# Patient Record
Sex: Female | Born: 1965 | Race: Black or African American | Hispanic: No | Marital: Single | Smoking: Former smoker
Health system: Southern US, Community
[De-identification: ages and names within clinical notes are randomized; demographics above are authoritative.]

## PROBLEM LIST (undated history)

## (undated) DIAGNOSIS — F40232 Fear of other medical care: Secondary | ICD-10-CM

## (undated) DIAGNOSIS — T8859XA Other complications of anesthesia, initial encounter: Secondary | ICD-10-CM

## (undated) HISTORY — PX: HERNIA REPAIR: SHX51

## (undated) HISTORY — DX: Morbid (severe) obesity due to excess calories: E66.01

---

## 1999-05-19 ENCOUNTER — Emergency Department (HOSPITAL_COMMUNITY): Admission: EM | Admit: 1999-05-19 | Discharge: 1999-05-19 | Payer: Self-pay | Admitting: Emergency Medicine

## 2000-05-09 ENCOUNTER — Encounter: Admission: RE | Admit: 2000-05-09 | Discharge: 2000-06-29 | Payer: Self-pay | Admitting: Sports Medicine

## 2000-07-10 ENCOUNTER — Encounter: Admission: RE | Admit: 2000-07-10 | Discharge: 2000-10-08 | Payer: Self-pay | Admitting: *Deleted

## 2002-09-22 ENCOUNTER — Emergency Department (HOSPITAL_COMMUNITY): Admission: EM | Admit: 2002-09-22 | Discharge: 2002-09-23 | Payer: Self-pay | Admitting: Emergency Medicine

## 2002-09-28 ENCOUNTER — Emergency Department (HOSPITAL_COMMUNITY): Admission: EM | Admit: 2002-09-28 | Discharge: 2002-09-28 | Payer: Self-pay | Admitting: Emergency Medicine

## 2003-08-05 ENCOUNTER — Emergency Department (HOSPITAL_COMMUNITY): Admission: EM | Admit: 2003-08-05 | Discharge: 2003-08-05 | Payer: Self-pay | Admitting: Emergency Medicine

## 2004-11-13 ENCOUNTER — Emergency Department (HOSPITAL_COMMUNITY): Admission: EM | Admit: 2004-11-13 | Discharge: 2004-11-13 | Payer: Self-pay | Admitting: Emergency Medicine

## 2005-04-02 ENCOUNTER — Emergency Department (HOSPITAL_COMMUNITY): Admission: EM | Admit: 2005-04-02 | Discharge: 2005-04-02 | Payer: Self-pay | Admitting: Emergency Medicine

## 2005-07-18 ENCOUNTER — Emergency Department (HOSPITAL_COMMUNITY): Admission: EM | Admit: 2005-07-18 | Discharge: 2005-07-18 | Payer: Self-pay | Admitting: Emergency Medicine

## 2006-01-15 ENCOUNTER — Emergency Department (HOSPITAL_COMMUNITY): Admission: EM | Admit: 2006-01-15 | Discharge: 2006-01-16 | Payer: Self-pay | Admitting: Emergency Medicine

## 2006-05-20 ENCOUNTER — Emergency Department (HOSPITAL_COMMUNITY): Admission: EM | Admit: 2006-05-20 | Discharge: 2006-05-20 | Payer: Self-pay | Admitting: Emergency Medicine

## 2007-03-06 ENCOUNTER — Emergency Department (HOSPITAL_COMMUNITY): Admission: EM | Admit: 2007-03-06 | Discharge: 2007-03-06 | Payer: Self-pay | Admitting: *Deleted

## 2008-05-18 ENCOUNTER — Inpatient Hospital Stay (HOSPITAL_COMMUNITY): Admission: AD | Admit: 2008-05-18 | Discharge: 2008-05-21 | Payer: Self-pay | Admitting: Obstetrics & Gynecology

## 2008-10-07 ENCOUNTER — Emergency Department (HOSPITAL_COMMUNITY): Admission: EM | Admit: 2008-10-07 | Discharge: 2008-10-07 | Payer: Self-pay | Admitting: Emergency Medicine

## 2009-01-19 ENCOUNTER — Encounter: Admission: RE | Admit: 2009-01-19 | Discharge: 2009-01-19 | Payer: Self-pay | Admitting: Internal Medicine

## 2010-01-04 ENCOUNTER — Emergency Department (HOSPITAL_COMMUNITY): Admission: EM | Admit: 2010-01-04 | Discharge: 2010-01-04 | Payer: Self-pay | Admitting: Family Medicine

## 2010-04-28 ENCOUNTER — Emergency Department (HOSPITAL_COMMUNITY): Admission: EM | Admit: 2010-04-28 | Discharge: 2010-01-02 | Payer: Self-pay | Admitting: Emergency Medicine

## 2010-08-30 LAB — CBC
Hemoglobin: 13.5 g/dL (ref 12.0–15.0)
MCHC: 34.1 g/dL (ref 30.0–36.0)
MCV: 86.9 fL (ref 78.0–100.0)
RBC: 4.57 MIL/uL (ref 3.87–5.11)

## 2010-08-30 LAB — POCT I-STAT, CHEM 8
BUN: 10 mg/dL (ref 6–23)
Chloride: 106 mEq/L (ref 96–112)
Creatinine, Ser: 0.9 mg/dL (ref 0.4–1.2)
Potassium: 3.7 mEq/L (ref 3.5–5.1)
Sodium: 137 mEq/L (ref 135–145)

## 2010-08-30 LAB — DIFFERENTIAL
Basophils Relative: 2 % — ABNORMAL HIGH (ref 0–1)
Eosinophils Absolute: 0.2 10*3/uL (ref 0.0–0.7)
Monocytes Absolute: 0.3 10*3/uL (ref 0.1–1.0)
Monocytes Relative: 3 % (ref 3–12)

## 2011-02-24 LAB — CBC
HCT: 22.7 % — ABNORMAL LOW (ref 36.0–46.0)
Hemoglobin: 11.7 g/dL — ABNORMAL LOW (ref 12.0–15.0)
MCHC: 33.7 g/dL (ref 30.0–36.0)
MCV: 95.1 fL (ref 78.0–100.0)
MCV: 96.6 fL (ref 78.0–100.0)
Platelets: 152 10*3/uL (ref 150–400)
RBC: 3.66 MIL/uL — ABNORMAL LOW (ref 3.87–5.11)
WBC: 14.2 10*3/uL — ABNORMAL HIGH (ref 4.0–10.5)

## 2013-11-17 ENCOUNTER — Emergency Department (HOSPITAL_COMMUNITY)
Admission: EM | Admit: 2013-11-17 | Discharge: 2013-11-17 | Disposition: A | Discharge status: Home / Self Care | Payer: Self-pay | Attending: Emergency Medicine | Admitting: Emergency Medicine

## 2013-11-17 ENCOUNTER — Encounter (HOSPITAL_COMMUNITY): Payer: Self-pay | Admitting: Emergency Medicine

## 2013-11-17 DIAGNOSIS — F17200 Nicotine dependence, unspecified, uncomplicated: Secondary | ICD-10-CM | POA: Insufficient documentation

## 2013-11-17 DIAGNOSIS — L509 Urticaria, unspecified: Secondary | ICD-10-CM | POA: Insufficient documentation

## 2013-11-17 DIAGNOSIS — R110 Nausea: Secondary | ICD-10-CM | POA: Insufficient documentation

## 2013-11-17 MED ORDER — DEXAMETHASONE SODIUM PHOSPHATE 10 MG/ML IJ SOLN
10.0000 mg | Freq: Once | INTRAMUSCULAR | Status: AC
  Administered 2013-11-17: 10 mg via INTRAMUSCULAR
  Filled 2013-11-17: qty 1

## 2013-11-17 MED ORDER — HYDROXYZINE HCL 25 MG PO TABS: 25.0000 mg | ORAL_TABLET | Freq: Four times a day (QID) | ORAL | Status: DC

## 2013-11-17 MED ORDER — DIPHENHYDRAMINE HCL 25 MG PO CAPS
25.0000 mg | ORAL_CAPSULE | Freq: Once | ORAL | Status: AC
  Administered 2013-11-17: 25 mg via ORAL
  Filled 2013-11-17: qty 1

## 2013-11-17 MED ORDER — ONDANSETRON 4 MG PO TBDP
4.0000 mg | ORAL_TABLET | Freq: Once | ORAL | Status: AC
Start: 2013-11-17 — End: 2013-11-17
  Administered 2013-11-17: 4 mg via ORAL
  Filled 2013-11-17: qty 1

## 2013-11-17 NOTE — ED Provider Notes (Signed)
CSN: [id number]     Arrival date & time 11/17/13  1845 History  This chart was scribed for non-physician practitioner, Ivonne AndrewPeter Dammen, PA-C working with Shanna CiscoMegan E Docherty, MD by Luisa DagoPriscilla Tutu, ED scribe. This patient was seen in room WTR9/WTR9 and the patient's care was started at 10:17 PM.    Chief Complaint  Patient presents with  . Rash    x1 week  . Nausea    The history is provided by the patient. No language interpreter was used.   HPI Comments: Colleen Mills is a 48 y.o. female who presents to the Emergency Department complaining of a gradual onset rash that pt first noticed approximately 1 weeks ago. Pt is also complaining of associated nausea. She states that the rash has been slowly spreading. Pt states that when she gets bit by mosquitoes she gets really itchy. Pt reports using cortisone creme and benadryl at home without relief. Her last dose was this morning. Pt reports being allergic to Aleve. Denies any exposure to new soaps, facial swelling, fever, chills, diaphoresis, or food allergies. She denies any exposure to poison Ivy.   History reviewed. No pertinent past medical history. Past Surgical History  Procedure Laterality Date  . Hernia repair      as a child   No family history on file. History  Substance Use Topics  . Smoking status: Current Every Day Smoker -- 0.50 packs/day    Types: Cigarettes  . Smokeless tobacco: Never Used  . Alcohol Use: Yes     Comment: occ   OB History   Grav Para Term Preterm Abortions TAB SAB Ect Mult Living                 Review of Systems  Constitutional: Negative for fever and chills.  Respiratory: Negative for cough and shortness of breath.   Cardiovascular: Negative for chest pain.  Gastrointestinal: Positive for nausea. Negative for vomiting and abdominal pain.  Skin: Positive for rash.   Allergies  Review of patient's allergies indicates no known allergies.  Home Medications   Prior to Admission medications   Not on  File   BP 126/84  Pulse 86  Temp(Src) 98 F (36.7 C) (Oral)  Resp 14  SpO2 99%  LMP 10/16/2013  Physical Exam  Nursing note and vitals reviewed. Constitutional: She is oriented to person, place, and time. She appears well-developed and well-nourished. No distress.  HENT:  Head: Normocephalic and atraumatic.  Mouth/Throat: Oropharynx is clear and moist.  Eyes: Conjunctivae and EOM are normal.  Neck: Neck supple. No tracheal deviation present.  Cardiovascular: Normal rate.   Pulmonary/Chest: Effort normal. No respiratory distress. She has no wheezes. She has no rales.  Musculoskeletal: Normal range of motion.  Neurological: She is alert and oriented to person, place, and time.  Skin: Skin is warm and dry. Rash noted.  Urticarial type lesions to bilateral thighs and lower extremities. Similar lesions to the lower abdomen and arms.  Psychiatric: She has a normal mood and affect. Her behavior is normal.    ED Course  Procedures  DIAGNOSTIC STUDIES: Oxygen Saturation is 99% on RA, normal by my interpretation.    COORDINATION OF CARE: 10:20 PM- Pt advised of plan for treatment and pt agrees. Advised pt to take cool showers for the next couple of days.     Medications  dexamethasone (DECADRON) injection 10 mg (not administered)  ondansetron (ZOFRAN-ODT) disintegrating tablet 4 mg (4 mg Oral Given 11/17/13 2036)  diphenhydrAMINE (BENADRYL) capsule  25 mg (25 mg Oral Given 11/17/13 2036)      MDM   Final diagnoses:  Hives      I personally performed the services described in this documentation, which was scribed in my presence. The recorded information has been reviewed and is accurate.    Angus SellerPeter S Dammen, PA-C 11/18/13 [phone]

## 2013-11-17 NOTE — Discharge Instructions (Signed)
You may continue Benadryl and hydrocortisone cream for your rash. Followup with a primary care provider.    Hives Hives are itchy, red, swollen areas of the skin. They can vary in size and location on your body. Hives can come and go for hours or several days (acute hives) or for several weeks (chronic hives). Hives do not spread from person to person (noncontagious). They may get worse with scratching, exercise, and emotional stress. CAUSES   Allergic reaction to food, additives, or drugs.  Infections, including the common cold.  Illness, such as vasculitis, lupus, or thyroid disease.  Exposure to sunlight, heat, or cold.  Exercise.  Stress.  Contact with chemicals. SYMPTOMS   Red or white swollen patches on the skin. The patches may change size, shape, and location quickly and repeatedly.  Itching.  Swelling of the hands, feet, and face. This may occur if hives develop deeper in the skin. DIAGNOSIS  Your caregiver can usually tell what is wrong by performing a physical exam. Skin or blood tests may also be done to determine the cause of your hives. In some cases, the cause cannot be determined. TREATMENT  Mild cases usually get better with medicines such as antihistamines. Severe cases may require an emergency epinephrine injection. If the cause of your hives is known, treatment includes avoiding that trigger.  HOME CARE INSTRUCTIONS   Avoid causes that trigger your hives.  Take antihistamines as directed by your caregiver to reduce the severity of your hives. Non-sedating or low-sedating antihistamines are usually recommended. Do not drive while taking an antihistamine.  Take any other medicines prescribed for itching as directed by your caregiver.  Wear loose-fitting clothing.  Keep all follow-up appointments as directed by your caregiver. SEEK MEDICAL CARE IF:   You have persistent or severe itching that is not relieved with medicine.  You have painful or swollen  joints. SEEK IMMEDIATE MEDICAL CARE IF:   You have a fever.  Your tongue or lips are swollen.  You have trouble breathing or swallowing.  You feel tightness in the throat or chest.  You have abdominal pain. These problems may be the first sign of a life-threatening allergic reaction. Call your local emergency services (911 in [place] MAKE SURE YOU:   Understand these instructions.  Will watch your condition.  Will get help right away if you are not doing well or get worse. Document Released: 05/08/2005 Document Revised: 05/13/2013 Document Reviewed: 08/01/2011 [facility] Patient Information 2015 [place], [place]. This information is not intended to replace advice given to you by your health care provider. Make sure you discuss any questions you have with your health care provider.

## 2013-11-17 NOTE — ED Notes (Signed)
Patient c/o rash x1 week. States rash has spread since she arrived. Welts notes to patient legs and arms. Patient believes the rash may be related to mosquitoes. Patient states she has been using cortisone creme and benadryl at home without relief, last dose this AM.

## 2013-11-18 NOTE — ED Provider Notes (Signed)
Medical screening examination/treatment/procedure(s) were performed by non-physician practitioner and as supervising physician I was immediately available for consultation/collaboration.   Shanna CiscoMegan E Docherty, MD 11/18/13 [phone]

## 2014-05-06 ENCOUNTER — Emergency Department (HOSPITAL_COMMUNITY): Payer: Self-pay

## 2014-05-06 ENCOUNTER — Emergency Department (HOSPITAL_COMMUNITY)
Admission: EM | Admit: 2014-05-06 | Discharge: 2014-05-06 | Disposition: A | Discharge status: Home / Self Care | Payer: Self-pay | Attending: Emergency Medicine | Admitting: Emergency Medicine

## 2014-05-06 ENCOUNTER — Encounter (HOSPITAL_COMMUNITY): Payer: Self-pay | Admitting: Emergency Medicine

## 2014-05-06 DIAGNOSIS — Z720 Tobacco use: Secondary | ICD-10-CM | POA: Insufficient documentation

## 2014-05-06 DIAGNOSIS — R42 Dizziness and giddiness: Secondary | ICD-10-CM | POA: Insufficient documentation

## 2014-05-06 DIAGNOSIS — R0981 Nasal congestion: Secondary | ICD-10-CM | POA: Insufficient documentation

## 2014-05-06 LAB — BASIC METABOLIC PANEL
Anion gap: 12 (ref 5–15)
BUN: 11 mg/dL (ref 6–23)
CO2: 23 meq/L (ref 19–32)
Calcium: 9.8 mg/dL (ref 8.4–10.5)
Chloride: 101 mEq/L (ref 96–112)
Creatinine, Ser: 0.88 mg/dL (ref 0.50–1.10)
GFR calc Af Amer: 90 mL/min — ABNORMAL LOW (ref >90)
GFR calc non Af Amer: 78 mL/min — ABNORMAL LOW (ref >90)
GLUCOSE: 79 mg/dL (ref 70–99)
POTASSIUM: 4.3 meq/L (ref 3.7–5.3)
SODIUM: 136 meq/L — AB (ref 137–147)

## 2014-05-06 LAB — CBC WITH DIFFERENTIAL/PLATELET
Basophils Absolute: 0 10*3/uL (ref 0.0–0.1)
Basophils Relative: 0 % (ref 0–1)
Eosinophils Absolute: 0.4 10*3/uL (ref 0.0–0.7)
Eosinophils Relative: 5 % (ref 0–5)
HCT: 40.1 % (ref 36.0–46.0)
HEMOGLOBIN: 13.5 g/dL (ref 12.0–15.0)
LYMPHS ABS: 3 10*3/uL (ref 0.7–4.0)
LYMPHS PCT: 34 % (ref 12–46)
MCH: 30.3 pg (ref 26.0–34.0)
MCHC: 33.7 g/dL (ref 30.0–36.0)
MCV: 89.9 fL (ref 78.0–100.0)
MONOS PCT: 6 % (ref 3–12)
Monocytes Absolute: 0.6 10*3/uL (ref 0.1–1.0)
NEUTROS PCT: 55 % (ref 43–77)
Neutro Abs: 4.9 10*3/uL (ref 1.7–7.7)
PLATELETS: 235 10*3/uL (ref 150–400)
RBC: 4.46 MIL/uL (ref 3.87–5.11)
RDW: 13.5 % (ref 11.5–15.5)
WBC: 8.9 10*3/uL (ref 4.0–10.5)

## 2014-05-06 MED ORDER — MECLIZINE HCL 25 MG PO TABS: 25.0000 mg | ORAL_TABLET | Freq: Three times a day (TID) | ORAL | Status: DC | PRN

## 2014-05-06 MED ORDER — MECLIZINE HCL 25 MG PO TABS
25.0000 mg | ORAL_TABLET | Freq: Once | ORAL | Status: AC
  Administered 2014-05-06: 25 mg via ORAL
  Filled 2014-05-06: qty 1

## 2014-05-06 MED ORDER — SODIUM CHLORIDE 0.9 % IV BOLUS (SEPSIS)
500.0000 mL | Freq: Once | INTRAVENOUS | Status: AC
  Administered 2014-05-06: 500 mL via INTRAVENOUS

## 2014-05-06 NOTE — Discharge Instructions (Signed)

## 2014-05-06 NOTE — ED Notes (Signed)
Pt alert, arrives from home, c/o dizziness, onset was last weekend, speech clear, denies pain, ambulates to triage, resp even unlabored, skin pwd

## 2014-05-06 NOTE — ED Notes (Signed)
Pt complaint of dizziness since Sunday. Pt denies SOB, CP, or other pain at present time. Pt on cardiac monitor at present time; NSR.

## 2014-05-06 NOTE — ED Provider Notes (Signed)
CSN: [id number]     Arrival date & time 05/06/14  1301 History   First MD Initiated Contact with Patient 05/06/14 1502     Chief Complaint  Patient presents with  . Dizziness     (Consider location/radiation/quality/duration/timing/severity/associated sxs/prior Treatment) HPI Comments: Presents to the ER for evaluation of dizziness. She reports that she first started to feel lightheaded and "woozy" three days ago. The next day she started to feel sinus congestion which has continued. Today she feels worse, now feeling like she is spinning if she tilts her head back or turns her head. No headache. No hearing loss, cough, shortness of breath, chest pain, palpitations.  Patient is a 48 y.o. female presenting with dizziness.  Dizziness   History reviewed. No pertinent past medical history. Past Surgical History  Procedure Laterality Date  . Hernia repair      as a child   No family history on file. History  Substance Use Topics  . Smoking status: Current Every Day Smoker -- 0.50 packs/day    Types: Cigarettes  . Smokeless tobacco: Never Used  . Alcohol Use: Yes     Comment: occ   OB History    No data available     Review of Systems  Neurological: Positive for dizziness.  All other systems reviewed and are negative.     Allergies  Review of patient's allergies indicates no known allergies.  Home Medications   Prior to Admission medications   Medication Sig Start Date End Date Taking? Authorizing Provider  ibuprofen (ADVIL,MOTRIN) 200 MG tablet Take 400 mg by mouth every 6 (six) hours as needed for moderate pain.   Yes Historical Provider, MD  hydrOXYzine (ATARAX/VISTARIL) 25 MG tablet Take 1 tablet (25 mg total) by mouth every 6 (six) hours. Patient not taking: Reported on 05/06/2014 11/17/13   Phill MutterPeter S Dammen, PA-C   BP 113/83 mmHg  Pulse 72  Temp(Src) 98.3 F (36.8 C) (Oral)  Resp 18  SpO2 100%  LMP 04/08/2014 Physical Exam  Constitutional: She is oriented to  person, place, and time. She appears well-developed and well-nourished. No distress.  HENT:  Head: Normocephalic and atraumatic.  Right Ear: Hearing normal.  Left Ear: Hearing normal.  Nose: Nose normal.  Mouth/Throat: Oropharynx is clear and moist and mucous membranes are normal.  Eyes: Conjunctivae and EOM are normal. Pupils are equal, round, and reactive to light.  Neck: Normal range of motion. Neck supple.  Cardiovascular: Regular rhythm, S1 normal and S2 normal.  Exam reveals no gallop and no friction rub.   No murmur heard. Pulmonary/Chest: Effort normal and breath sounds normal. No respiratory distress. She exhibits no tenderness.  Abdominal: Soft. Normal appearance and bowel sounds are normal. There is no hepatosplenomegaly. There is no tenderness. There is no rebound, no guarding, no tenderness at McBurney's point and negative Murphy's sign. No hernia.  Musculoskeletal: Normal range of motion.  Neurological: She is alert and oriented to person, place, and time. She has normal strength. No cranial nerve deficit or sensory deficit. Coordination normal. GCS eye subscore is 4. GCS verbal subscore is 5. GCS motor subscore is 6.     Skin: Skin is warm, dry and intact. No rash noted. No cyanosis.  Psychiatric: She has a normal mood and affect. Her speech is normal and behavior is normal. Thought content normal.  Nursing note and vitals reviewed.  Additional Neuro Exam: Extraocular muscle movement: normal No visual field cut Pupils: equal and reactive both direct and consensual response  is normal No nystagmus present    Sensory function is intact to light touch, pinprick Proprioception intact  Grip strength 5/5 symmetric in upper extremities Lower extremity strength 5/5 against gravity No pronator drift Normal finger to nose bilaterally Normal heel to shin bilaterally  Gait: normal  ED Course  Procedures (including critical care time) Labs Review Labs Reviewed  BASIC  METABOLIC PANEL - Abnormal; Notable for the following:    Sodium 136 (*)    GFR calc non Af Amer 78 (*)    GFR calc Af Amer 90 (*)    All other components within normal limits  CBC WITH DIFFERENTIAL    Imaging Review Ct Head Wo Contrast  05/06/2014   CLINICAL DATA:  Headache with dizziness and nausea for 4 days.  EXAM: CT HEAD WITHOUT CONTRAST  TECHNIQUE: Contiguous axial images were obtained from the base of the skull through the vertex without intravenous contrast.  COMPARISON:  None.  FINDINGS: No evidence of an acute infarct, acute hemorrhage, mass lesion, mass effect or hydrocephalus. Visualized portions of the paranasal sinuses and mastoid air cells are clear.  IMPRESSION: Negative.   Electronically Signed   By: Leanna BattlesMelinda  Blietz M.D.   On: 05/06/2014 15:26     EKG Interpretation None      MDM   Final diagnoses:  Dizziness   Patient presents to the ER for evaluation of dizziness. She started as lightheadedness 3 days ago followed by sinus congestion. Today she has had more vertiginous type dizziness. Her neurologic examination was normal. No neurologic findings on examination. CT head was normal. Lab work was normal. Patient will be discharged, treated for peripheral vertigo, return if symptoms worsen.    Gilda Creasehristopher J. Pollina, MD 05/06/14 [phone]

## 2014-05-18 ENCOUNTER — Emergency Department (HOSPITAL_COMMUNITY): Payer: Self-pay

## 2014-05-18 ENCOUNTER — Emergency Department (HOSPITAL_COMMUNITY)
Admission: EM | Admit: 2014-05-18 | Discharge: 2014-05-18 | Disposition: A | Discharge status: Home / Self Care | Payer: Self-pay | Attending: Emergency Medicine | Admitting: Emergency Medicine

## 2014-05-18 ENCOUNTER — Encounter (HOSPITAL_COMMUNITY): Payer: Self-pay | Admitting: *Deleted

## 2014-05-18 DIAGNOSIS — Z79899 Other long term (current) drug therapy: Secondary | ICD-10-CM | POA: Insufficient documentation

## 2014-05-18 DIAGNOSIS — R0789 Other chest pain: Secondary | ICD-10-CM | POA: Insufficient documentation

## 2014-05-18 DIAGNOSIS — R0600 Dyspnea, unspecified: Secondary | ICD-10-CM | POA: Insufficient documentation

## 2014-05-18 LAB — BASIC METABOLIC PANEL
ANION GAP: 5 (ref 5–15)
BUN: 10 mg/dL (ref 6–23)
CALCIUM: 8.5 mg/dL (ref 8.4–10.5)
CHLORIDE: 106 meq/L (ref 96–112)
CO2: 24 mmol/L (ref 19–32)
CREATININE: 0.79 mg/dL (ref 0.50–1.10)
GFR calc Af Amer: >90 mL/min (ref >90)
GFR calc non Af Amer: >90 mL/min (ref >90)
GLUCOSE: 92 mg/dL (ref 70–99)
Potassium: 4 mmol/L (ref 3.5–5.1)
Sodium: 135 mmol/L (ref 135–145)

## 2014-05-18 LAB — CBC
HEMATOCRIT: 38.4 % (ref 36.0–46.0)
HEMOGLOBIN: 12.9 g/dL (ref 12.0–15.0)
MCH: 30.3 pg (ref 26.0–34.0)
MCHC: 33.6 g/dL (ref 30.0–36.0)
MCV: 90.1 fL (ref 78.0–100.0)
Platelets: 252 10*3/uL (ref 150–400)
RBC: 4.26 MIL/uL (ref 3.87–5.11)
RDW: 13.6 % (ref 11.5–15.5)
WBC: 9.3 10*3/uL (ref 4.0–10.5)

## 2014-05-18 LAB — D-DIMER, QUANTITATIVE: D-Dimer, Quant: 0.33 ug/mL-FEU (ref 0.00–0.48)

## 2014-05-18 LAB — TROPONIN I

## 2014-05-18 LAB — BRAIN NATRIURETIC PEPTIDE: B Natriuretic Peptide: 27.6 pg/mL (ref 0.0–100.0)

## 2014-05-18 MED ORDER — ALBUTEROL SULFATE (2.5 MG/3ML) 0.083% IN NEBU
5.0000 mg | INHALATION_SOLUTION | Freq: Once | RESPIRATORY_TRACT | Status: AC
  Administered 2014-05-18: 5 mg via RESPIRATORY_TRACT
  Filled 2014-05-18: qty 6

## 2014-05-18 MED ORDER — ALBUTEROL SULFATE HFA 108 (90 BASE) MCG/ACT IN AERS: 2.0000 | INHALATION_SPRAY | RESPIRATORY_TRACT | Status: DC | PRN

## 2014-05-18 NOTE — Discharge Instructions (Signed)
It was our pleasure to provide your ER care today - we hope that you feel better.  Avoid smoking.  Use albuterol inhaler as need if wheezing.  If cough, try mucinex or robutussin as need.  Follow up with primary care doctor in coming week for recheck - see referral.  Return to ER if worse, new symptoms, fevers, chest pain, trouble breathing, other concern.     Shortness of Breath Shortness of breath means you have trouble breathing. It could also mean that you have a medical problem. You should get immediate medical care for shortness of breath. CAUSES   Not enough oxygen in the air such as with high altitudes or a smoke-filled room.  Certain lung diseases, infections, or problems.  Heart disease or conditions, such as angina or heart failure.  Low red blood cells (anemia).  Poor physical fitness, which can cause shortness of breath when you exercise.  Chest or back injuries or stiffness.  Being overweight.  Smoking.  Anxiety, which can make you feel like you are not getting enough air. DIAGNOSIS  Serious medical problems can often be found during your physical exam. Tests may also be done to determine why you are having shortness of breath. Tests may include:  Chest X-rays.  Lung function tests.  Blood tests.  An electrocardiogram (ECG).  An ambulatory electrocardiogram. An ambulatory ECG records your heartbeat patterns over a 24-hour period.  Exercise testing.  A transthoracic echocardiogram (TTE). During echocardiography, sound waves are used to evaluate how blood flows through your heart.  A transesophageal echocardiogram (TEE).  Imaging scans. Your health care provider may not be able to find a cause for your shortness of breath after your exam. In this case, it is important to have a follow-up exam with your health care provider as directed.  TREATMENT  Treatment for shortness of breath depends on the cause of your symptoms and can vary greatly. HOME  CARE INSTRUCTIONS   Do not smoke. Smoking is a common cause of shortness of breath. If you smoke, ask for help to quit.  Avoid being around chemicals or things that may bother your breathing, such as paint fumes and dust.  Rest as needed. Slowly resume your usual activities.  If medicines were prescribed, take them as directed for the full length of time directed. This includes oxygen and any inhaled medicines.  Keep all follow-up appointments as directed by your health care provider. SEEK MEDICAL CARE IF:   Your condition does not improve in the time expected.  You have a hard time doing your normal activities even with rest.  You have any new symptoms. SEEK IMMEDIATE MEDICAL CARE IF:   Your shortness of breath gets worse.  You feel light-headed, faint, or develop a cough not controlled with medicines.  You start coughing up blood.  You have pain with breathing.  You have chest pain or pain in your arms, shoulders, or abdomen.  You have a fever.  You are unable to walk up stairs or exercise the way you normally do. MAKE SURE YOU:  Understand these instructions.  Will watch your condition.  Will get help right away if you are not doing well or get worse. Document Released: 01/31/2001 Document Revised: 05/13/2013 Document Reviewed: 07/24/2011 [facility] Patient Information 2015 [place], [place]. This information is not intended to replace advice given to you by your health care provider. Make sure you discuss any questions you have with your health care provider.    Smoking Hazards Smoking cigarettes  is extremely bad for your health. Tobacco smoke has over 200 known poisons in it. It contains the poisonous gases nitrogen oxide and carbon monoxide. There are over 60 chemicals in tobacco smoke that cause cancer. Some of the chemicals found in cigarette smoke include:   Cyanide.   Benzene.   Formaldehyde.   Methanol (wood alcohol).   Acetylene (fuel used in  welding torches).   Ammonia.  Even smoking lightly shortens your life expectancy by several years. You can greatly reduce the risk of medical problems for you and your family by stopping now. Smoking is the most preventable cause of death and disease in our society. Within days of quitting smoking, your circulation improves, you decrease the risk of having a heart attack, and your lung capacity improves. There may be some increased phlegm in the first few days after quitting, and it may take months for your lungs to clear up completely. Quitting for 10 years reduces your risk of developing lung cancer to almost that of a nonsmoker.  WHAT ARE THE RISKS OF SMOKING? Cigarette smokers have an increased risk of many serious medical problems, including:  Lung cancer.   Lung disease (such as pneumonia, bronchitis, and emphysema).   Heart attack and chest pain due to the heart not getting enough oxygen (angina).   Heart disease and peripheral blood vessel disease.   Hypertension.   Stroke.   Oral cancer (cancer of the lip, mouth, or voice box).   Bladder cancer.   Pancreatic cancer.   Cervical cancer.   Pregnancy complications, including premature birth.   Stillbirths and smaller newborn babies, birth defects, and genetic damage to sperm.   Early menopause.   Lower estrogen level for women.   Infertility.   Facial wrinkles.   Blindness.   Increased risk of broken bones (fractures).   Senile dementia.   Stomach ulcers and internal bleeding.   Delayed wound healing and increased risk of complications during surgery. Because of secondhand smoke exposure, children of smokers have an increased risk of the following:   Sudden infant death syndrome (SIDS).   Respiratory infections.   Lung cancer.   Heart disease.   Ear infections.  WHY IS SMOKING ADDICTIVE? Nicotine is the chemical agent in tobacco that is capable of causing addiction or  dependence. When you smoke and inhale, nicotine is absorbed rapidly into the bloodstream through your lungs. Both inhaled and noninhaled nicotine may be addictive.  WHAT ARE THE BENEFITS OF QUITTING?  There are many health benefits to quitting smoking. Some are:   The likelihood of developing cancer and heart disease decreases. Health improvements are seen almost immediately.   Blood pressure, pulse rate, and breathing patterns start returning to normal soon after quitting.   People who quit may see an improvement in their overall quality of life.  HOW DO YOU QUIT SMOKING? Smoking is an addiction with both physical and psychological effects, and longtime habits can be hard to change. Your health care provider can recommend:  Programs and community resources, which may include group support, education, or therapy.  Replacement products, such as patches, gum, and nasal sprays. Use these products only as directed. Do not replace cigarette smoking with electronic cigarettes (commonly called e-cigarettes). The safety of e-cigarettes is unknown, and some may contain harmful chemicals. FOR MORE INFORMATION  American Lung Association: www.lung.org  American Cancer Society: www.cancer.org Document Released: 06/15/2004 Document Revised: 02/26/2013 Document Reviewed: 10/28/2012 [facility] Patient Information 2015 [place]. This information is not intended to  replace advice given to you by your health care provider. Make sure you discuss any questions you have with your health care provider.     Chest Pain (Nonspecific) It is often hard to give a specific diagnosis for the cause of chest pain. There is always a chance that your pain could be related to something serious, such as a heart attack or a blood clot in the lungs. You need to follow up with your health care provider for further evaluation. CAUSES   Heartburn.  Pneumonia or bronchitis.  Anxiety or stress.  Inflammation around  your heart (pericarditis) or lung (pleuritis or pleurisy).  A blood clot in the lung.  A collapsed lung (pneumothorax). It can develop suddenly on its own (spontaneous pneumothorax) or from trauma to the chest.  Shingles infection (herpes zoster virus). The chest wall is composed of bones, muscles, and cartilage. Any of these can be the source of the pain.  The bones can be bruised by injury.  The muscles or cartilage can be strained by coughing or overwork.  The cartilage can be affected by inflammation and become sore (costochondritis). DIAGNOSIS  Lab tests or other studies may be needed to find the cause of your pain. Your health care provider may have you take a test called an ambulatory electrocardiogram (ECG). An ECG records your heartbeat patterns over a 24-hour period. You may also have other tests, such as:  Transthoracic echocardiogram (TTE). During echocardiography, sound waves are used to evaluate how blood flows through your heart.  Transesophageal echocardiogram (TEE).  Cardiac monitoring. This allows your health care provider to monitor your heart rate and rhythm in real time.  Holter monitor. This is a portable device that records your heartbeat and can help diagnose heart arrhythmias. It allows your health care provider to track your heart activity for several days, if needed.  Stress tests by exercise or by giving medicine that makes the heart beat faster. TREATMENT   Treatment depends on what may be causing your chest pain. Treatment may include:  Acid blockers for heartburn.  Anti-inflammatory medicine.  Pain medicine for inflammatory conditions.  Antibiotics if an infection is present.  You may be advised to change lifestyle habits. This includes stopping smoking and avoiding alcohol, caffeine, and chocolate.  You may be advised to keep your head raised (elevated) when sleeping. This reduces the chance of acid going backward from your stomach into your  esophagus. Most of the time, nonspecific chest pain will improve within 2-3 days with rest and mild pain medicine.  HOME CARE INSTRUCTIONS   If antibiotics were prescribed, take them as directed. Finish them even if you start to feel better.  For the next few days, avoid physical activities that bring on chest pain. Continue physical activities as directed.  Do not use any tobacco products, including cigarettes, chewing tobacco, or electronic cigarettes.  Avoid drinking alcohol.  Only take medicine as directed by your health care provider.  Follow your health care provider's suggestions for further testing if your chest pain does not go away.  Keep any follow-up appointments you made. If you do not go to an appointment, you could develop lasting (chronic) problems with pain. If there is any problem keeping an appointment, call to reschedule. SEEK MEDICAL CARE IF:   Your chest pain does not go away, even after treatment.  You have a rash with blisters on your chest.  You have a fever. SEEK IMMEDIATE MEDICAL CARE IF:   You have increased chest  pain or pain that spreads to your arm, neck, jaw, back, or abdomen.  You have shortness of breath.  You have an increasing cough, or you cough up blood.  You have severe back or abdominal pain.  You feel nauseous or vomit.  You have severe weakness.  You faint.  You have chills. This is an emergency. Do not wait to see if the pain will go away. Get medical help at once. Call your local emergency services (911 in [place] Do not drive yourself to the hospital. MAKE SURE YOU:   Understand these instructions.  Will watch your condition.  Will get help right away if you are not doing well or get worse. Document Released: 02/15/2005 Document Revised: 05/13/2013 Document Reviewed: 12/12/2007 [facility] Patient Information 2015 [place], [place]. This information is not intended to replace advice given to you by your health care provider.  Make sure you discuss any questions you have with your health care provider.

## 2014-05-18 NOTE — ED Notes (Addendum)
Pt reports SOB/ difficulty breathing starting last night. Able to speak in full sentences. Reports she must "take deep breaths". Also reports left hand feels swollen and tight. Denies pain. Denies cough. Denies n/v/d.  Hx cigarette smoking x20 years.

## 2014-05-18 NOTE — ED Notes (Signed)
Patient transported to X-ray 

## 2014-05-18 NOTE — ED Provider Notes (Signed)
CSN: [id number]     Arrival date & time 05/18/14  1123 History   First MD Initiated Contact with Patient 05/18/14 1154     Chief Complaint  Patient presents with  . Shortness of Breath  . arm tightness      (Consider location/radiation/quality/duration/timing/severity/associated sxs/prior Treatment) Patient is a 48 y.o. female presenting with shortness of breath. The history is provided by the patient.  Shortness of Breath Associated symptoms: cough   Associated symptoms: no abdominal pain, no fever, no headaches, no neck pain, no rash, no sore throat and no vomiting   pt c/o feeling sob, and having non productive cough for the past day.  Cough episodic, intermittent, states w coughing spells, feels tight across chest. No hemoptysis. No episodic/exertional chest pain or discomfort. Denies sore throat or runny nose.  Pt denies fever or chills. No leg pain or swelling. No recent immobility, trauma, surgery, or prolonged travel. No known ill contacts. Multiple pack year smoking hx.  Denies hx copd or asthma, or any regular mdi use. No hx dvt or pe.  Denies any personal or fam hx of cad.       History reviewed. No pertinent past medical history. Past Surgical History  Procedure Laterality Date  . Hernia repair      as a child   History reviewed. No pertinent family history. History  Substance Use Topics  . Smoking status: Current Every Day Smoker -- 0.50 packs/day    Types: Cigarettes  . Smokeless tobacco: Never Used  . Alcohol Use: Yes     Comment: occ   OB History    No data available     Review of Systems  Constitutional: Negative for fever and chills.  HENT: Negative for sore throat.   Eyes: Negative for redness.  Respiratory: Positive for cough and shortness of breath.   Cardiovascular: Negative for palpitations and leg swelling.  Gastrointestinal: Negative for vomiting, abdominal pain and diarrhea.  Genitourinary: Negative for dysuria and flank pain.   Musculoskeletal: Negative for back pain and neck pain.  Skin: Negative for rash.  Neurological: Negative for weakness, numbness and headaches.  Hematological: Does not bruise/bleed easily.  Psychiatric/Behavioral: Negative for confusion.      Allergies  Review of patient's allergies indicates no known allergies.  Home Medications   Prior to Admission medications   Medication Sig Start Date End Date Taking? Authorizing Provider  ibuprofen (ADVIL,MOTRIN) 200 MG tablet Take 600 mg by mouth every 6 (six) hours as needed for moderate pain (back pain).    Yes Historical Provider, MD  meclizine (ANTIVERT) 25 MG tablet Take 1 tablet (25 mg total) by mouth 3 (three) times daily as needed for dizziness. 05/06/14  Yes Gilda Creasehristopher J. Pollina, MD  hydrOXYzine (ATARAX/VISTARIL) 25 MG tablet Take 1 tablet (25 mg total) by mouth every 6 (six) hours. Patient not taking: Reported on 05/06/2014 11/17/13   Phill MutterPeter S Dammen, PA-C   BP 135/85 mmHg  Pulse 75  Temp(Src) 98.7 F (37.1 C) (Oral)  Resp 20  SpO2 96%  LMP 05/18/2014 Physical Exam  Constitutional: She appears well-developed and well-nourished. No distress.  HENT:  Mouth/Throat: Oropharynx is clear and moist.  Eyes: Conjunctivae are normal. Pupils are equal, round, and reactive to light. No scleral icterus.  Neck: Neck supple. No tracheal deviation present.  Cardiovascular: Normal rate, regular rhythm, normal heart sounds and intact distal pulses.   Pulmonary/Chest: Effort normal. No respiratory distress. She exhibits tenderness.  Mild non prod cough. States feels tight  in chest for past day, esp w coughing.   Abdominal: Soft. Normal appearance and bowel sounds are normal. She exhibits no distension and no mass. There is no tenderness. There is no rebound and no guarding.  Musculoskeletal: She exhibits no edema or tenderness.  Neurological: She is alert.  Skin: Skin is warm and dry. No rash noted. She is not diaphoretic.  Psychiatric: She  has a normal mood and affect.  Nursing note and vitals reviewed.   ED Course  Procedures (including critical care time) Labs Review   Results for orders placed or performed during the hospital encounter of 05/18/14  CBC     (if pt has PMH of COPD)  Result Value Ref Range   WBC 9.3 4.0 - 10.5 K/uL   RBC 4.26 3.87 - 5.11 MIL/uL   Hemoglobin 12.9 12.0 - 15.0 g/dL   HCT 16.138.4 09.636.0 - 04.546.0 %   MCV 90.1 78.0 - 100.0 fL   MCH 30.3 26.0 - 34.0 pg   MCHC 33.6 30.0 - 36.0 g/dL   RDW 40.913.6 81.111.5 - 91.415.5 %   Platelets 252 150 - 400 K/uL  Basic metabolic panel    (if pt has PMH of COPD)  Result Value Ref Range   Sodium 135 135 - 145 mmol/L   Potassium 4.0 3.5 - 5.1 mmol/L   Chloride 106 96 - 112 mEq/L   CO2 24 19 - 32 mmol/L   Glucose, Bld 92 70 - 99 mg/dL   BUN 10 6 - 23 mg/dL   Creatinine, Ser 7.820.79 0.50 - 1.10 mg/dL   Calcium 8.5 8.4 - 95.610.5 mg/dL   GFR calc non Af Amer >90 >90 mL/min   GFR calc Af Amer >90 >90 mL/min   Anion gap 5 5 - 15  BNP (order ONLY if patient complains of dyspnea/SOB AND you have documented it for THIS visit)  Result Value Ref Range   B Natriuretic Peptide 27.6 0.0 - 100.0 pg/mL  D-dimer, quantitative  Result Value Ref Range   D-Dimer, Quant 0.33 0.00 - 0.48 ug/mL-FEU  Troponin I  Result Value Ref Range   Troponin I <0.03 <0.031 ng/mL   Dg Chest 2 View (if Patient Has Fever And/or Copd)  05/18/2014   CLINICAL DATA:  48 year old female with 1 day history of shortness of breath accompanied by chest and upper back soreness.  EXAM: CHEST  2 VIEW  COMPARISON:  Prior chest x-ray 01/19/2009  FINDINGS: The lungs are clear and negative for focal airspace consolidation, pulmonary edema or suspicious pulmonary nodule. Stable trace right apical pleural parenchymal scarring. No pleural effusion or pneumothorax. Cardiac and mediastinal contours are within normal limits. No acute fracture or lytic or blastic osseous lesions. The visualized upper abdominal bowel gas pattern is  unremarkable.  IMPRESSION: No active cardiopulmonary disease.   Electronically Signed   By: Malachy MoanHeath  McCullough M.D.   On: 05/18/2014 12:48   Ct Head Wo Contrast  05/06/2014   CLINICAL DATA:  Headache with dizziness and nausea for 4 days.  EXAM: CT HEAD WITHOUT CONTRAST  TECHNIQUE: Contiguous axial images were obtained from the base of the skull through the vertex without intravenous contrast.  COMPARISON:  None.  FINDINGS: No evidence of an acute infarct, acute hemorrhage, mass lesion, mass effect or hydrocephalus. Visualized portions of the paranasal sinuses and mastoid air cells are clear.  IMPRESSION: Negative.   Electronically Signed   By: Leanna BattlesMelinda  Blietz M.D.   On: 05/06/2014 15:26  EKG Interpretation   Date/Time:  Monday May 18 2014 14:52:22 EST Ventricular Rate:  95 PR Interval:  156 QRS Duration: 76 QT Interval:  354 QTC Calculation: 445 R Axis:   83 Text Interpretation:  Sinus rhythm \\E \nscl Confirmed by Denton Lank  MD, Caryn Bee  ([id number]) on 05/18/2014 3:30:38 PM      MDM   Labs. Cxr.  Reviewed nursing notes and prior charts for additional history.   Albuterol mdi tried for symptom relief.  ddimer normal, Wells/PERC neg.  After symptom duration >1 day, trop neg.   Recheck no wheezing or increased wob.   Recheck pt breathing comfortably, no pain.  Pt appears stable for d/c.          Suzi Roots, MD 05/18/14 [phone]

## 2015-05-10 ENCOUNTER — Emergency Department (HOSPITAL_COMMUNITY)
Admission: EM | Admit: 2015-05-10 | Discharge: 2015-05-10 | Disposition: A | Discharge status: Home / Self Care | Payer: Self-pay | Attending: Emergency Medicine | Admitting: Emergency Medicine

## 2015-05-10 ENCOUNTER — Encounter (HOSPITAL_COMMUNITY): Payer: Self-pay | Admitting: Nurse Practitioner

## 2015-05-10 DIAGNOSIS — R110 Nausea: Secondary | ICD-10-CM | POA: Insufficient documentation

## 2015-05-10 DIAGNOSIS — Z87891 Personal history of nicotine dependence: Secondary | ICD-10-CM | POA: Insufficient documentation

## 2015-05-10 DIAGNOSIS — Z79899 Other long term (current) drug therapy: Secondary | ICD-10-CM | POA: Insufficient documentation

## 2015-05-10 DIAGNOSIS — R5383 Other fatigue: Secondary | ICD-10-CM | POA: Insufficient documentation

## 2015-05-10 DIAGNOSIS — J019 Acute sinusitis, unspecified: Secondary | ICD-10-CM | POA: Insufficient documentation

## 2015-05-10 LAB — RAPID STREP SCREEN (MED CTR MEBANE ONLY): Streptococcus, Group A Screen (Direct): NEGATIVE

## 2015-05-10 MED ORDER — DIPHENHYDRAMINE HCL 25 MG PO CAPS: 25.0000 mg | ORAL_CAPSULE | Freq: Every evening | ORAL | Status: DC | PRN

## 2015-05-10 MED ORDER — SALINE SPRAY 0.65 % NA SOLN: 1.0000 | NASAL | Status: DC | PRN

## 2015-05-10 MED ORDER — IBUPROFEN 200 MG PO TABS: 600.0000 mg | ORAL_TABLET | Freq: Four times a day (QID) | ORAL | Status: DC | PRN

## 2015-05-10 MED ORDER — AMOXICILLIN-POT CLAVULANATE 875-125 MG PO TABS: 1.0000 | ORAL_TABLET | Freq: Two times a day (BID) | ORAL | Status: DC

## 2015-05-10 MED ORDER — BENZONATATE 100 MG PO CAPS: 100.0000 mg | ORAL_CAPSULE | Freq: Three times a day (TID) | ORAL | Status: DC | PRN

## 2015-05-10 MED ORDER — CETIRIZINE-PSEUDOEPHEDRINE ER 5-120 MG PO TB12: 1.0000 | ORAL_TABLET | Freq: Two times a day (BID) | ORAL | Status: DC

## 2015-05-10 MED ORDER — AMOXICILLIN-POT CLAVULANATE 875-125 MG PO TABS
1.0000 | ORAL_TABLET | Freq: Once | ORAL | Status: AC
  Administered 2015-05-10: 1 via ORAL
  Filled 2015-05-10: qty 1

## 2015-05-10 NOTE — Discharge Instructions (Signed)

## 2015-05-10 NOTE — ED Notes (Signed)
PT DISCHARGED. INSTRUCTIONS AND PRESCRIPTIONS GIVEN. AAOX3. PT IN NO APPARENT DISTRESS. THE OPPORTUNITY TO ASK QUESTIONS WAS PROVIDED. 

## 2015-05-10 NOTE — ED Notes (Signed)
Pt c/o cough, nasal congestion and sore throat, states symptoms have been ongoing since thanksgiving, denies n/v/d or any other symptoms.

## 2015-05-10 NOTE — ED Notes (Signed)
INITIAL ASSESSMENT COMPLETED. PT C/O NASAL CONGESTION AND SORE THROAT SINCE 11/24. DENIES FEVER. AWAITING FURTHER ORDERS.

## 2015-05-10 NOTE — ED Provider Notes (Signed)
CSN: [id number]     Arrival date & time 05/10/15  0008 History   First MD Initiated Contact with Patient 05/10/15 0024     Chief Complaint  Patient presents with  . Sore Throat  . Nasal Congestion  . Cough     (Consider location/radiation/quality/duration/timing/severity/associated sxs/prior Treatment) Patient is a 49 y.o. female presenting with pharyngitis, cough, and URI.  Sore Throat This is a new problem. Episode onset: 3 weeks ago. The problem has been waxing and waning. Associated symptoms include congestion, coughing, fatigue, nausea and a sore throat. Pertinent negatives include no fever, neck pain or vomiting. The symptoms are aggravated by swallowing. Treatments tried: Nasal spray; OTC medications. The treatment provided no relief.  Cough Associated symptoms: rhinorrhea and sore throat   Associated symptoms: no ear pain, no fever, no shortness of breath and no wheezing   URI Presenting symptoms: congestion, cough, fatigue, rhinorrhea and sore throat   Presenting symptoms: no ear pain and no fever   Congestion:    Location:  Nasal   Interferes with sleep: yes     Interferes with eating/drinking: no   Cough:    Cough characteristics:  Non-productive   Severity:  Moderate   Onset quality:  Gradual   Duration:  3 weeks   Timing:  Sporadic   Progression:  Waxing and waning   Chronicity:  New Rhinorrhea:    Quality:  Clear   Severity:  Moderate   Duration:  3 weeks   Timing:  Intermittent   Progression:  Worsening Severity:  Moderate Onset quality:  Gradual Duration:  3 weeks Timing:  Constant Progression:  Worsening Chronicity:  New Relieved by:  Nothing Ineffective treatments:  Decongestant, OTC medications and rest Associated symptoms: sinus pain   Associated symptoms: no neck pain and no wheezing   Risk factors: sick contacts     History reviewed. No pertinent past medical history. Past Surgical History  Procedure Laterality Date  . Hernia repair     as a child   History reviewed. No pertinent family history. Social History  Substance Use Topics  . Smoking status: Former Smoker -- 0.50 packs/day    Types: Cigarettes  . Smokeless tobacco: Never Used  . Alcohol Use: Yes     Comment: occ   OB History    No data available      Review of Systems  Constitutional: Positive for fatigue. Negative for fever.  HENT: Positive for congestion, postnasal drip, rhinorrhea, sinus pressure and sore throat. Negative for ear discharge and ear pain.   Respiratory: Positive for cough. Negative for shortness of breath and wheezing.   Gastrointestinal: Positive for nausea. Negative for vomiting and diarrhea.  Musculoskeletal: Negative for neck pain.  All other systems reviewed and are negative.   Allergies  Review of patient's allergies indicates no known allergies.  Home Medications   Prior to Admission medications   Medication Sig Start Date End Date Taking? Authorizing Provider  albuterol (PROVENTIL HFA;VENTOLIN HFA) 108 (90 BASE) MCG/ACT inhaler Inhale 2 puffs into the lungs every 4 (four) hours as needed for wheezing or shortness of breath. 05/18/14   Cathren Laine, MD  amoxicillin-clavulanate (AUGMENTIN) 875-125 MG tablet Take 1 tablet by mouth every 12 (twelve) hours. 05/10/15   Antony Madura, PA-C  benzonatate (TESSALON) 100 MG capsule Take 1 capsule (100 mg total) by mouth 3 (three) times daily as needed for cough. 05/10/15   Antony Madura, PA-C  cetirizine-pseudoephedrine (ZYRTEC-D) 5-120 MG tablet Take 1 tablet by mouth 2 (  two) times daily. 05/10/15   Antony MaduraKelly Kadeisha Betsch, PA-C  diphenhydrAMINE (BENADRYL) 25 mg capsule Take 1-2 capsules (25-50 mg total) by mouth at bedtime as needed for allergies or sleep. 05/10/15   Antony MaduraKelly Maor Meckel, PA-C  hydrOXYzine (ATARAX/VISTARIL) 25 MG tablet Take 1 tablet (25 mg total) by mouth every 6 (six) hours. Patient not taking: Reported on 05/06/2014 11/17/13   Ivonne AndrewPeter Dammen, PA-C  ibuprofen (ADVIL,MOTRIN) 200 MG tablet Take  3 tablets (600 mg total) by mouth every 6 (six) hours as needed for fever, headache, mild pain or moderate pain. 05/10/15   Antony MaduraKelly Clevie Prout, PA-C  meclizine (ANTIVERT) 25 MG tablet Take 1 tablet (25 mg total) by mouth 3 (three) times daily as needed for dizziness. 05/06/14   Gilda Creasehristopher J Pollina, MD  sodium chloride (OCEAN) 0.65 % SOLN nasal spray Place 1 spray into both nostrils as needed for congestion. 05/10/15   Antony MaduraKelly Dacotah Cabello, PA-C   BP 120/81 mmHg  Pulse 88  Temp(Src) 97.8 F (36.6 C) (Oral)  Resp 18  SpO2 100%   Physical Exam  Constitutional: She is oriented to person, place, and time. She appears well-developed and well-nourished. No distress.  Nontoxic/nonseptic appearing  HENT:  Head: Normocephalic and atraumatic.  Right Ear: Hearing, tympanic membrane, external ear and ear canal normal.  Left Ear: Hearing, tympanic membrane, external ear and ear canal normal.  Nose: Mucosal edema and rhinorrhea present. No septal deviation or nasal septal hematoma. Right sinus exhibits no maxillary sinus tenderness and no frontal sinus tenderness. Left sinus exhibits maxillary sinus tenderness. Left sinus exhibits no frontal sinus tenderness.  Mouth/Throat: Uvula is midline and mucous membranes are normal. Posterior oropharyngeal erythema present. No oropharyngeal exudate or posterior oropharyngeal edema.  Patient tolerating secretions without difficulty.  Eyes: Conjunctivae and EOM are normal. Pupils are equal, round, and reactive to light. No scleral icterus.  Neck: Normal range of motion.  No nuchal rigidity or meningismus.  Cardiovascular: Normal rate, regular rhythm and intact distal pulses.   Pulmonary/Chest: Effort normal and breath sounds normal. No respiratory distress. She has no wheezes. She has no rales.  Lungs CTAB. No accessory muscle use. Chest expansion symmetric.  Musculoskeletal: Normal range of motion.  Neurological: She is alert and oriented to person, place, and time. She  exhibits normal muscle tone. Coordination normal.  GCS 15. Patient moving all extremities. Ambulatory with steady gait.  Skin: Skin is warm and dry. No rash noted. She is not diaphoretic. No erythema. No pallor.  Psychiatric: She has a normal mood and affect. Her behavior is normal.  Nursing note and vitals reviewed.   ED Course  Procedures (including critical care time) Labs Review Labs Reviewed  RAPID STREP SCREEN (NOT AT [facility])  CULTURE, GROUP A STREP    Imaging Review No results found.   I have personally reviewed and evaluated these images and lab results as part of my medical decision-making.   EKG Interpretation None      MDM   Final diagnoses:  Acute sinusitis, recurrence not specified, unspecified location    Patient complaining of symptoms of sinusitis. Severe symptoms have been present for greater than 10 days with purulent nasal discharge and maxillary sinus pain. Concern for acute bacterial rhinosinusitis. Patient discharged with Augmentin. Instructions given for warm saline nasal wash and recommendations for follow-up with primary care physician. Return precautions provided at discharge. Patient discharged in good condition with no unaddressed concerns.    Antony MaduraKelly Mckenzie Toruno, PA-C 05/10/15 [id number]  Gilda Creasehristopher J Pollina, MD 05/10/15 [phone]

## 2015-05-12 LAB — CULTURE, GROUP A STREP: Strep A Culture: NEGATIVE

## 2015-08-19 ENCOUNTER — Encounter (HOSPITAL_COMMUNITY): Payer: Self-pay | Admitting: Emergency Medicine

## 2015-08-19 ENCOUNTER — Emergency Department (HOSPITAL_COMMUNITY): Payer: Self-pay

## 2015-08-19 ENCOUNTER — Emergency Department (HOSPITAL_COMMUNITY)
Admission: EM | Admit: 2015-08-19 | Discharge: 2015-08-20 | Disposition: A | Discharge status: Home / Self Care | Payer: Self-pay | Attending: Emergency Medicine | Admitting: Emergency Medicine

## 2015-08-19 DIAGNOSIS — Z87891 Personal history of nicotine dependence: Secondary | ICD-10-CM | POA: Insufficient documentation

## 2015-08-19 DIAGNOSIS — R51 Headache: Secondary | ICD-10-CM | POA: Insufficient documentation

## 2015-08-19 DIAGNOSIS — Z79899 Other long term (current) drug therapy: Secondary | ICD-10-CM | POA: Insufficient documentation

## 2015-08-19 DIAGNOSIS — M25512 Pain in left shoulder: Secondary | ICD-10-CM | POA: Insufficient documentation

## 2015-08-19 DIAGNOSIS — R0602 Shortness of breath: Secondary | ICD-10-CM | POA: Insufficient documentation

## 2015-08-19 NOTE — ED Notes (Signed)
Pt states she is having a hard time catching her breath  Pt states she had a sudden onset of shortness of breath about 2000  Pt is c/o pain in his right shoulder, upper arm, and chest  Pt is also c/o slight headache  Pt took an antiacid and it has made her burp for the past 2 hrs

## 2015-08-20 LAB — I-STAT CHEM 8, ED
BUN: 10 mg/dL (ref 6–20)
Calcium, Ion: 1.19 mmol/L (ref 1.12–1.23)
Chloride: 102 mmol/L (ref 101–111)
Creatinine, Ser: 0.8 mg/dL (ref 0.44–1.00)
Glucose, Bld: 104 mg/dL — ABNORMAL HIGH (ref 65–99)
HEMATOCRIT: 41 % (ref 36.0–46.0)
HEMOGLOBIN: 13.9 g/dL (ref 12.0–15.0)
POTASSIUM: 3.6 mmol/L (ref 3.5–5.1)
SODIUM: 139 mmol/L (ref 135–145)
TCO2: 24 mmol/L (ref 0–100)

## 2015-08-20 LAB — D-DIMER, QUANTITATIVE (NOT AT ARMC)

## 2015-08-20 MED ORDER — ALBUTEROL SULFATE (2.5 MG/3ML) 0.083% IN NEBU
5.0000 mg | INHALATION_SOLUTION | Freq: Once | RESPIRATORY_TRACT | Status: AC
  Administered 2015-08-20: 5 mg via RESPIRATORY_TRACT
  Filled 2015-08-20: qty 6

## 2015-08-20 MED ORDER — ALBUTEROL SULFATE HFA 108 (90 BASE) MCG/ACT IN AERS
2.0000 | INHALATION_SPRAY | Freq: Once | RESPIRATORY_TRACT | Status: DC
  Filled 2015-08-20: qty 6.7

## 2015-08-20 NOTE — Discharge Instructions (Signed)

## 2015-08-20 NOTE — ED Provider Notes (Signed)
CSN: [id number]     Arrival date & time 08/19/15  2230 History  By signing my name below, I, Linus GalasMaharshi Patel, attest that this documentation has been prepared under the direction and in the presence of Azalia BilisKevin Halli Equihua, MD. Electronically Signed: Linus GalasMaharshi Patel, ED Scribe. 08/20/2015. 12:44 AM.   Chief Complaint  Patient presents with  . Shortness of Breath   The history is provided by the patient. No language interpreter was used.   HPI Comments: Colleen Mills is a 50 y.o. female brought in with mother to the Emergency Department with no pertinent PMHx  complaining of SOB that began 5 hours ago. Pt also reports mild HA and left shoulder pain. Pt states she has trouble catching her breath. She states she has been burping and took an antacid with no relief. Pt denies cough, fever, chills, or any other symptoms at this time. Pt stopped smoking 1 year ago. Pt smoke 1/2 pack a day for 15 years.   Pt is not on birth control. No PMHx of FMHx of PE or DVT.  Pt does not have a PCP.   History reviewed. No pertinent past medical history. Past Surgical History  Procedure Laterality Date  . Hernia repair      as a child   Family History  Problem Relation Age of Onset  . Hypertension Other    Social History  Substance Use Topics  . Smoking status: Former Smoker -- 0.50 packs/day    Types: Cigarettes  . Smokeless tobacco: Never Used  . Alcohol Use: Yes     Comment: occ   OB History    No data available     Review of Systems A complete 10 system review of systems was obtained and all systems are negative except as noted in the HPI and PMH.    Allergies  Review of patient's allergies indicates no known allergies.  Home Medications   Prior to Admission medications   Medication Sig Start Date End Date Taking? Authorizing Provider  albuterol (PROVENTIL HFA;VENTOLIN HFA) 108 (90 BASE) MCG/ACT inhaler Inhale 2 puffs into the lungs every 4 (four) hours as needed for wheezing or shortness of  breath. 05/18/14  Yes Cathren LaineKevin Steinl, MD  famotidine-calcium carbonate-magnesium hydroxide (ACID REDUCER COMPLETE) 10-800-165 MG chewable tablet Chew 1 tablet by mouth daily as needed.   Yes Historical Provider, MD  amoxicillin-clavulanate (AUGMENTIN) 875-125 MG tablet Take 1 tablet by mouth every 12 (twelve) hours. Patient not taking: Reported on 08/19/2015 05/10/15   Antony MaduraKelly Humes, PA-C  benzonatate (TESSALON) 100 MG capsule Take 1 capsule (100 mg total) by mouth 3 (three) times daily as needed for cough. Patient not taking: Reported on 08/19/2015 05/10/15   Antony MaduraKelly Humes, PA-C  cetirizine-pseudoephedrine (ZYRTEC-D) 5-120 MG tablet Take 1 tablet by mouth 2 (two) times daily. Patient not taking: Reported on 08/19/2015 05/10/15   Antony MaduraKelly Humes, PA-C  diphenhydrAMINE (BENADRYL) 25 mg capsule Take 1-2 capsules (25-50 mg total) by mouth at bedtime as needed for allergies or sleep. Patient not taking: Reported on 08/19/2015 05/10/15   Antony MaduraKelly Humes, PA-C  hydrOXYzine (ATARAX/VISTARIL) 25 MG tablet Take 1 tablet (25 mg total) by mouth every 6 (six) hours. Patient not taking: Reported on 05/06/2014 11/17/13   Ivonne AndrewPeter Dammen, PA-C  ibuprofen (ADVIL,MOTRIN) 200 MG tablet Take 3 tablets (600 mg total) by mouth every 6 (six) hours as needed for fever, headache, mild pain or moderate pain. Patient not taking: Reported on 08/19/2015 05/10/15   Antony MaduraKelly Humes, PA-C  meclizine (ANTIVERT) 25  MG tablet Take 1 tablet (25 mg total) by mouth 3 (three) times daily as needed for dizziness. Patient not taking: Reported on 08/19/2015 05/06/14   Gilda Crease, MD  sodium chloride (OCEAN) 0.65 % SOLN nasal spray Place 1 spray into both nostrils as needed for congestion. Patient not taking: Reported on 08/19/2015 05/10/15   Antony Madura, PA-C   BP 122/84 mmHg  Pulse 88  Temp(Src) 98.3 F (36.8 C) (Oral)  Resp 20  SpO2 100%  LMP 07/25/2015 (Exact Date)   Physical Exam  Constitutional: She is oriented to person, place, and time.  She appears well-developed and well-nourished. No distress.  HENT:  Head: Normocephalic and atraumatic.  Eyes: EOM are normal.  Neck: Normal range of motion.  Cardiovascular: Normal rate, regular rhythm and normal heart sounds.   Pulmonary/Chest: Effort normal and breath sounds normal.  Abdominal: Soft. She exhibits no distension. There is no tenderness.  Musculoskeletal: Normal range of motion.  Neurological: She is alert and oriented to person, place, and time.  Skin: Skin is warm and dry.  Psychiatric: She has a normal mood and affect. Judgment normal.  Nursing note and vitals reviewed.   ED Course  Procedures   DIAGNOSTIC STUDIES: Oxygen Saturation is 100% on room air, normal by my interpretation.    COORDINATION OF CARE: 12:39 AM Will order breathing treatment Discussed treatment plan with pt and mother at bedside and they agreed to plan.W  Labs Review Labs Reviewed  I-STAT CHEM 8, ED - Abnormal; Notable for the following:    Glucose, Bld 104 (*)    All other components within normal limits  D-DIMER, QUANTITATIVE (NOT AT [facility])    Imaging Review Dg Chest 2 View  08/19/2015  CLINICAL DATA:  50 year old female with shortness of breath EXAM: CHEST  2 VIEW COMPARISON:  Chest radiograph dated 05/18/2014 FINDINGS: The heart size and mediastinal contours are within normal limits. Both lungs are clear. The visualized skeletal structures are unremarkable. IMPRESSION: No active cardiopulmonary disease. Electronically Signed   By: Elgie Collard M.D.   On: 08/19/2015 23:46   I have personally reviewed and evaluated these images and lab results as part of my medical decision-making.   EKG Interpretation   Date/Time:  Thursday August 19 2015 22:48:04 EDT Ventricular Rate:  82 PR Interval:  160 QRS Duration: 85 QT Interval:  348 QTC Calculation: 406 R Axis:   55 Text Interpretation:  Sinus rhythm Consider left atrial enlargement Low  voltage, precordial leads No significant  change was found Confirmed by  Brittanni Cariker  MD, Donalee Gaumond ([id number]) on 08/20/2015 12:43:09 AM      MDM   Final diagnoses:  Shortness of breath    Her complaint is that she feels like she needs to keep occasionally taking a deep breath in order to breathe better.  Her chest x-ray is normal her EKG is without ischemic changes.  D-dimer is normal.  Hemoglobin is normal.  Her pulse ox is 100% and her pulse rate is normal.  I do not think she needs additional workup at this time.  Atypical gastroesophageal reflux disease is one possibility.  Mild anxiety is another.  She can walk without difficulty. I personally performed the services described in this documentation, which was scribed in my presence. The recorded information has been reviewed and is accurate.       Azalia Bilis, MD 08/20/15 [phone]

## 2015-08-20 NOTE — ED Notes (Signed)
Patient denies pain and is resting comfortably.  

## 2015-09-27 ENCOUNTER — Emergency Department (HOSPITAL_COMMUNITY)
Admission: EM | Admit: 2015-09-27 | Discharge: 2015-09-27 | Disposition: A | Discharge status: Home / Self Care | Payer: Self-pay | Attending: Emergency Medicine | Admitting: Emergency Medicine

## 2015-09-27 ENCOUNTER — Emergency Department (HOSPITAL_COMMUNITY): Payer: Self-pay

## 2015-09-27 ENCOUNTER — Encounter (HOSPITAL_COMMUNITY): Payer: Self-pay

## 2015-09-27 DIAGNOSIS — R093 Abnormal sputum: Secondary | ICD-10-CM | POA: Insufficient documentation

## 2015-09-27 DIAGNOSIS — J069 Acute upper respiratory infection, unspecified: Secondary | ICD-10-CM | POA: Insufficient documentation

## 2015-09-27 DIAGNOSIS — Z79899 Other long term (current) drug therapy: Secondary | ICD-10-CM | POA: Insufficient documentation

## 2015-09-27 DIAGNOSIS — Z7951 Long term (current) use of inhaled steroids: Secondary | ICD-10-CM | POA: Insufficient documentation

## 2015-09-27 DIAGNOSIS — B9789 Other viral agents as the cause of diseases classified elsewhere: Secondary | ICD-10-CM

## 2015-09-27 DIAGNOSIS — Z87891 Personal history of nicotine dependence: Secondary | ICD-10-CM | POA: Insufficient documentation

## 2015-09-27 MED ORDER — BENZONATATE 100 MG PO CAPS: 100.0000 mg | ORAL_CAPSULE | Freq: Three times a day (TID) | ORAL | Status: DC

## 2015-09-27 NOTE — ED Provider Notes (Signed)
CSN: [id number]     Arrival date & time 09/27/15  4098 History   First MD Initiated Contact with Patient 09/27/15 1101     Chief Complaint  Patient presents with  . Cough  . Sore Throat  . Chest Congestion    (Consider location/radiation/quality/duration/timing/severity/associated sxs/prior Treatment) HPI  50 y.o. female presents to the Emergency Department today complaining of URI symptoms x 3 days. States productive cough, sinus congestion, rhinorrhea, sore throat, and generalized body aches. Notes sore throat is 8/10 on pain scale and sore. Notes no sick contacts. No fevers. No N/V/D. No neck stiffness. No trouble swallowing. Able to tolerate PO. No CP/SOB/ABD pain. No other symptoms noted.   History reviewed. No pertinent past medical history. Past Surgical History  Procedure Laterality Date  . Hernia repair      as a child   Family History  Problem Relation Age of Onset  . Hypertension Other    Social History  Substance Use Topics  . Smoking status: Former Smoker -- 0.50 packs/day    Types: Cigarettes  . Smokeless tobacco: Never Used  . Alcohol Use: Yes     Comment: occ   OB History    No data available     Review of Systems  Constitutional: Negative for fever.  HENT: Positive for congestion, sinus pressure and sore throat. Negative for drooling.   Respiratory: Positive for cough. Negative for chest tightness and shortness of breath.   Cardiovascular: Negative for chest pain.  Musculoskeletal: Negative for neck pain and neck stiffness.   Allergies  Review of patient's allergies indicates no known allergies.  Home Medications   Prior to Admission medications   Medication Sig Start Date End Date Taking? Authorizing Provider  albuterol (PROVENTIL HFA;VENTOLIN HFA) 108 (90 BASE) MCG/ACT inhaler Inhale 2 puffs into the lungs every 4 (four) hours as needed for wheezing or shortness of breath. 05/18/14   Cathren Laine, MD  famotidine-calcium carbonate-magnesium hydroxide  (ACID REDUCER COMPLETE) 10-800-165 MG chewable tablet Chew 1 tablet by mouth daily as needed.    Historical Provider, MD   BP 131/81 mmHg  Pulse 76  Temp(Src) 98.3 F (36.8 C) (Oral)  Resp 17  Ht  (1.753 m)  Wt 112.492 kg  BMI 36.61 kg/m2  SpO2 97%  LMP 09/27/2015   Physical Exam  Constitutional: She is oriented to person, place, and time. She appears well-developed and well-nourished. No distress.  HENT:  Head: Normocephalic and atraumatic.  Right Ear: Tympanic membrane, external ear and ear canal normal.  Left Ear: Tympanic membrane, external ear and ear canal normal.  Nose: Nose normal.  Mouth/Throat: Uvula is midline and mucous membranes are normal. No trismus in the jaw. Normal dentition. No dental abscesses, uvula swelling or dental caries. Posterior oropharyngeal erythema present. No oropharyngeal exudate or tonsillar abscesses.  Eyes: EOM are normal. Pupils are equal, round, and reactive to light.  Neck: Normal range of motion. Neck supple. No tracheal deviation present.  Cardiovascular: Normal rate, regular rhythm, S1 normal, S2 normal, normal heart sounds, intact distal pulses and normal pulses.   Pulmonary/Chest: Effort normal and breath sounds normal. No respiratory distress. She has no decreased breath sounds. She has no wheezes. She has no rhonchi. She has no rales.  Abdominal: Normal appearance and bowel sounds are normal. There is no tenderness.  Musculoskeletal: Normal range of motion.  Neurological: She is alert and oriented to person, place, and time.  Skin: Skin is warm and dry.  Psychiatric: She has a  normal mood and affect. Her speech is normal and behavior is normal. Thought content normal.   ED Course  Procedures (including critical care time) Labs Review Labs Reviewed - No data to display  Imaging Review Dg Chest 2 View  09/27/2015  CLINICAL DATA:  Cough with congestion, sore throat and chest tightness for 3 days. Nonsmoker. EXAM: CHEST  2 VIEW  COMPARISON:  08/19/2015 and 05/18/2014. FINDINGS: The heart size and mediastinal contours are normal. The lungs are clear. There is no pleural effusion or pneumothorax. No acute osseous findings are identified. IMPRESSION: Stable chest.  No active cardiopulmonary process. Electronically Signed   By: Carey BullocksWilliam  Veazey M.D.   On: 09/27/2015 11:42   I have personally reviewed and evaluated these images and lab results as part of my medical decision-making.   EKG Interpretation None      MDM  I have reviewed and evaluated the relevant imaging studies.  I have reviewed the relevant previous healthcare records. I obtained HPI from historian.  ED Course:  Assessment: Pt is a 49yF presents with URI symptoms x 3 days . On exam, pt in NAD. VSS. Afebrile. Throat with some postoro erythema, no exudate. No trismus. No ludwigs. Full ROM of neck without pain. Lungs CTA, Heart RRR. Abdomen nontender/soft. Pt CXR negative for acute infiltrate. Patients symptoms are consistent with URI, likely viral etiology. Discussed that antibiotics are not indicated for viral infections. Pt will be discharged with symptomatic treatment.  Verbalizes understanding and is agreeable with plan. Pt is hemodynamically stable & in NAD prior to dc.  Disposition/Plan:  DC Home Additional Verbal discharge instructions given and discussed with patient.  Pt Instructed to f/u with PCP in the next week for evaluation and treatment of symptoms. Return precautions given Pt acknowledges and agrees with plan  Supervising Physician Arby BarretteMarcy Pfeiffer, MD   Final diagnoses:  Viral URI with cough      Audry Piliyler Lin Glazier, PA-C 09/27/15 1158  Arby BarretteMarcy Pfeiffer, MD 09/28/15 [phone]

## 2015-09-27 NOTE — ED Notes (Signed)
Pt c/o cough, sore throat, chest congestion, and headache x 3 days.  Pain score 8/10.  Pt reports taking OTC medications w/o relief.

## 2015-09-27 NOTE — Discharge Instructions (Signed)
Please read and follow all provided instructions.  Your diagnoses today include:  1. Viral URI with cough    You appear to have an upper respiratory infection (URI). An upper respiratory tract infection, or cold, is a viral infection of the air passages leading to the lungs. It should improve gradually after 5-7 days. You may have a lingering cough that lasts for 2- 4 weeks after the infection.  Tests performed today include:  Vital signs. See below for your results today.   Medications prescribed:   Take any prescribed medications only as directed. Treatment for your infection is aimed at treating the symptoms. There are no medications, such as antibiotics, that will cure your infection.   Home care instructions:  Follow any educational materials contained in this packet.   Your illness is contagious and can be spread to others, especially during the first 3 or 4 days. It cannot be cured by antibiotics or other medicines. Take basic precautions such as washing your hands often, covering your mouth when you cough or sneeze, and avoiding public places where you could spread your illness to others.   Please continue drinking plenty of fluids.  Use over-the-counter medicines as needed as directed on packaging for symptom relief.  You may also use ibuprofen or tylenol as directed on packaging for pain or fever.  Do not take multiple medicines containing Tylenol or acetaminophen to avoid taking too much of this medication.  Follow-up instructions: Please follow-up with your primary care provider in the next 3 days for further evaluation of your symptoms if you are not feeling better.   Return instructions:   Please return to the Emergency Department if you experience worsening symptoms.   RETURN IMMEDIATELY IF you develop shortness of breath, confusion or altered mental status, a new rash, become dizzy, faint, or poorly responsive, or are unable to be cared for at home.  Please return if  you have persistent vomiting and cannot keep down fluids or develop a fever that is not controlled by tylenol or motrin.    Please return if you have any other emergent concerns.  Additional Information:  Your vital signs today were: BP 131/81 mmHg   Pulse 76   Temp(Src) 98.3 F (36.8 C) (Oral)   Resp 17   Ht 5\' 9"  (1.753 m)   Wt 112.492 kg   BMI 36.61 kg/m2   SpO2 97%   LMP 09/27/2015 If your blood pressure (BP) was elevated above 135/85 this visit, please have this repeated by your doctor within one month. --------------

## 2016-01-29 ENCOUNTER — Encounter (HOSPITAL_COMMUNITY): Payer: Self-pay

## 2016-01-29 ENCOUNTER — Emergency Department (HOSPITAL_COMMUNITY): Payer: No Typology Code available for payment source

## 2016-01-29 ENCOUNTER — Emergency Department (HOSPITAL_COMMUNITY)
Admission: EM | Admit: 2016-01-29 | Discharge: 2016-01-30 | Disposition: A | Discharge status: Home / Self Care | Payer: No Typology Code available for payment source | Attending: Emergency Medicine | Admitting: Emergency Medicine

## 2016-01-29 DIAGNOSIS — S29011A Strain of muscle and tendon of front wall of thorax, initial encounter: Secondary | ICD-10-CM

## 2016-01-29 DIAGNOSIS — Z87891 Personal history of nicotine dependence: Secondary | ICD-10-CM | POA: Diagnosis not present

## 2016-01-29 DIAGNOSIS — Y92410 Unspecified street and highway as the place of occurrence of the external cause: Secondary | ICD-10-CM | POA: Diagnosis not present

## 2016-01-29 DIAGNOSIS — Y9389 Activity, other specified: Secondary | ICD-10-CM | POA: Insufficient documentation

## 2016-01-29 DIAGNOSIS — Z7951 Long term (current) use of inhaled steroids: Secondary | ICD-10-CM | POA: Insufficient documentation

## 2016-01-29 DIAGNOSIS — Z79899 Other long term (current) drug therapy: Secondary | ICD-10-CM | POA: Diagnosis not present

## 2016-01-29 DIAGNOSIS — Y999 Unspecified external cause status: Secondary | ICD-10-CM | POA: Diagnosis not present

## 2016-01-29 DIAGNOSIS — R0602 Shortness of breath: Secondary | ICD-10-CM | POA: Diagnosis present
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
## 2016-01-30 MED ORDER — NAPROXEN 500 MG PO TABS: 500.0000 mg | ORAL_TABLET | Freq: Two times a day (BID) | ORAL | 0 refills | Status: DC

## 2016-01-30 MED ORDER — NAPROXEN 500 MG PO TABS
500.0000 mg | ORAL_TABLET | Freq: Once | ORAL | Status: AC
  Administered 2016-01-30: 500 mg via ORAL
  Filled 2016-01-30: qty 1

## 2016-01-30 NOTE — Discharge Instructions (Signed)
Take naproxen as prescribed. Use an incentive spirometer once per hour while awake to ensure that you are taking deep breaths. Follow-up with your primary care doctor to ensure resolution of your symptoms. You may return for any new or concerning symptoms.

## 2016-02-17 ENCOUNTER — Encounter (HOSPITAL_COMMUNITY): Payer: Self-pay | Admitting: Emergency Medicine

## 2016-02-17 ENCOUNTER — Emergency Department (HOSPITAL_COMMUNITY): Payer: No Typology Code available for payment source

## 2016-02-17 DIAGNOSIS — R0789 Other chest pain: Secondary | ICD-10-CM | POA: Insufficient documentation

## 2016-02-17 DIAGNOSIS — R079 Chest pain, unspecified: Secondary | ICD-10-CM | POA: Diagnosis present

## 2016-02-17 DIAGNOSIS — Z87891 Personal history of nicotine dependence: Secondary | ICD-10-CM | POA: Insufficient documentation

## 2016-02-17 LAB — CBC
HCT: 37.1 % (ref 36.0–46.0)
Hemoglobin: 12.7 g/dL (ref 12.0–15.0)
MCH: 30.3 pg (ref 26.0–34.0)
MCHC: 34.2 g/dL (ref 30.0–36.0)
MCV: 88.5 fL (ref 78.0–100.0)
PLATELETS: 232 10*3/uL (ref 150–400)
RBC: 4.19 MIL/uL (ref 3.87–5.11)
RDW: 13.6 % (ref 11.5–15.5)
WBC: 8.6 10*3/uL (ref 4.0–10.5)

## 2016-02-17 LAB — BASIC METABOLIC PANEL
Anion gap: 4 — ABNORMAL LOW (ref 5–15)
BUN: 16 mg/dL (ref 6–20)
CO2: 24 mmol/L (ref 22–32)
CREATININE: 0.88 mg/dL (ref 0.44–1.00)
Calcium: 8.9 mg/dL (ref 8.9–10.3)
Chloride: 109 mmol/L (ref 101–111)
GFR calc Af Amer: >60 mL/min (ref >60)
Glucose, Bld: 88 mg/dL (ref 65–99)
Potassium: 3.8 mmol/L (ref 3.5–5.1)
SODIUM: 137 mmol/L (ref 135–145)

## 2016-02-17 LAB — I-STAT TROPONIN, ED: Troponin i, poc: 0 ng/mL (ref 0.00–0.08)
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
## 2016-02-18 ENCOUNTER — Emergency Department (HOSPITAL_COMMUNITY)
Admission: EM | Admit: 2016-02-18 | Discharge: 2016-02-18 | Disposition: A | Discharge status: Home / Self Care | Payer: Self-pay | Attending: Emergency Medicine | Admitting: Emergency Medicine

## 2016-02-18 ENCOUNTER — Encounter (HOSPITAL_COMMUNITY): Payer: Self-pay | Admitting: *Deleted

## 2016-02-18 ENCOUNTER — Emergency Department (HOSPITAL_COMMUNITY)
Admission: EM | Admit: 2016-02-18 | Discharge: 2016-02-18 | Disposition: A | Discharge status: Home / Self Care | Payer: No Typology Code available for payment source | Attending: Emergency Medicine | Admitting: Emergency Medicine

## 2016-02-18 DIAGNOSIS — Z79899 Other long term (current) drug therapy: Secondary | ICD-10-CM | POA: Insufficient documentation

## 2016-02-18 DIAGNOSIS — R0789 Other chest pain: Secondary | ICD-10-CM

## 2016-02-18 DIAGNOSIS — Z87891 Personal history of nicotine dependence: Secondary | ICD-10-CM | POA: Insufficient documentation

## 2016-02-18 DIAGNOSIS — Z791 Long term (current) use of non-steroidal anti-inflammatories (NSAID): Secondary | ICD-10-CM | POA: Insufficient documentation

## 2016-02-18 DIAGNOSIS — T39315A Adverse effect of propionic acid derivatives, initial encounter: Secondary | ICD-10-CM | POA: Insufficient documentation

## 2016-02-18 DIAGNOSIS — T7840XA Allergy, unspecified, initial encounter: Secondary | ICD-10-CM | POA: Insufficient documentation

## 2016-02-18 MED ORDER — NAPROXEN 500 MG PO TABS: 500.0000 mg | ORAL_TABLET | Freq: Two times a day (BID) | ORAL | 0 refills | Status: DC

## 2016-02-18 MED ORDER — METHOCARBAMOL 500 MG PO TABS
500.0000 mg | ORAL_TABLET | Freq: Once | ORAL | Status: AC
  Administered 2016-02-18: 500 mg via ORAL
  Filled 2016-02-18: qty 1

## 2016-02-18 MED ORDER — PREDNISONE 20 MG PO TABS
60.0000 mg | ORAL_TABLET | Freq: Once | ORAL | Status: AC
  Administered 2016-02-18: 60 mg via ORAL
  Filled 2016-02-18: qty 3

## 2016-02-18 MED ORDER — METHOCARBAMOL 500 MG PO TABS: 500.0000 mg | ORAL_TABLET | Freq: Two times a day (BID) | ORAL | 0 refills | Status: DC

## 2016-02-18 MED ORDER — NAPROXEN 500 MG PO TABS
500.0000 mg | ORAL_TABLET | Freq: Once | ORAL | Status: AC
  Administered 2016-02-18: 500 mg via ORAL
  Filled 2016-02-18: qty 1

## 2016-02-18 MED ORDER — ONDANSETRON 4 MG PO TBDP
4.0000 mg | ORAL_TABLET | Freq: Once | ORAL | Status: AC
  Administered 2016-02-18: 4 mg via ORAL
  Filled 2016-02-18: qty 1

## 2016-02-18 MED ORDER — FAMOTIDINE 20 MG PO TABS
20.0000 mg | ORAL_TABLET | Freq: Once | ORAL | Status: AC
  Administered 2016-02-18: 20 mg via ORAL
  Filled 2016-02-18: qty 1

## 2016-02-18 MED ORDER — ONDANSETRON 4 MG PO TBDP: 4.0000 mg | ORAL_TABLET | Freq: Three times a day (TID) | ORAL | 0 refills | Status: DC | PRN

## 2016-02-18 MED ORDER — DIPHENHYDRAMINE HCL 25 MG PO CAPS
25.0000 mg | ORAL_CAPSULE | Freq: Once | ORAL | Status: AC
  Administered 2016-02-18: 25 mg via ORAL
  Filled 2016-02-18: qty 1

## 2016-02-18 NOTE — Discharge Instructions (Signed)
Take the prescribed medication as directed. Follow-up with a primary care doctor in the area.  See list of clinics or you may call hotline number for help with this. Return to the ED for new or worsening symptoms.

## 2016-02-18 NOTE — Discharge Instructions (Signed)
Take zofran as needed for nausea. Take benadryl as needed for itching, swelling or rash.  25 mg by mouth every 4-6 hours as needed.

## 2016-02-18 NOTE — ED Notes (Signed)
Water given  

## 2016-02-18 NOTE — ED Provider Notes (Signed)
[facility] DEPT Provider Note   CSN: [id number] Arrival date & time: 02/18/16  1055     History   Chief Complaint Chief Complaint  Patient presents with  . Allergic Reaction    HPI Colleen Mills is a 50 y.o. female.  HPI  Patient is a 50 year old female presents to the emergency department for reported allergic reaction to naproxen. She states that she was given Aleve last night when she was here in the ER and evaluated for chest wall pain. She went home after negative workup and this morning between 7 and 8 AM she began to have chills, numbness and nausea.  She's had a similar reaction to Aleve in the past.  She denies vomiting, chest pain, shortness of breath, wheeze, stridor, abdominal pain. She had no rash, no swelling of her lips face, no sensation of throat closure, no stridor.  History reviewed. No pertinent past medical history.  There are no active problems to display for this patient.   Past Surgical History:  Procedure Laterality Date  . HERNIA REPAIR     as a child    OB History    No data available       Home Medications    Prior to Admission medications   Medication Sig Start Date End Date Taking? Authorizing Provider  albuterol (PROVENTIL HFA;VENTOLIN HFA) 108 (90 BASE) MCG/ACT inhaler Inhale 2 puffs into the lungs every 4 (four) hours as needed for wheezing or shortness of breath. 05/18/14   Cathren Laine, MD  benzonatate (TESSALON) 100 MG capsule Take 1 capsule (100 mg total) by mouth every 8 (eight) hours. Patient not taking: Reported on 01/30/2016 09/27/15   Audry Pili, PA-C  ibuprofen (ADVIL,MOTRIN) 800 MG tablet Take 800 mg by mouth every 8 (eight) hours as needed (for pain).    Historical Provider, MD  methocarbamol (ROBAXIN) 500 MG tablet Take 1 tablet (500 mg total) by mouth 2 (two) times daily. 02/18/16   Garlon Hatchet, PA-C  naproxen (NAPROSYN) 500 MG tablet Take 1 tablet (500 mg total) by mouth 2 (two) times daily with a meal. 02/18/16   Garlon Hatchet, PA-C  ondansetron (ZOFRAN ODT) 4 MG disintegrating tablet Take 1 tablet (4 mg total) by mouth every 8 (eight) hours as needed for nausea or vomiting. 02/18/16   Danelle Berry, PA-C    Family History Family History  Problem Relation Age of Onset  . Hypertension Other     Social History Social History  Substance Use Topics  . Smoking status: Former Smoker    Packs/day: 0.50    Types: Cigarettes  . Smokeless tobacco: Never Used  . Alcohol use Yes     Comment: occ     Allergies   Aleve [naproxen sodium]   Review of Systems Review of Systems  All other systems reviewed and are negative.    Physical Exam Updated Vital Signs BP 101/80 (BP Location: Right Arm)   Pulse 101   Temp 98.4 F (36.9 C) (Oral)   Resp 18   SpO2 97%   Physical Exam  Constitutional: She is oriented to person, place, and time. She appears well-developed and well-nourished. No distress.  HENT:  Head: Normocephalic and atraumatic.  Right Ear: External ear normal.  Left Ear: External ear normal.  Nose: Nose normal.  Mouth/Throat: Oropharynx is clear and moist. No oropharyngeal exudate.  Posterior pharynx clear, no edema, erythema or exudate. Uvula midline No swelling of lips or face  Eyes: Conjunctivae and EOM are  normal. Pupils are equal, round, and reactive to light. Right eye exhibits no discharge. Left eye exhibits no discharge. No scleral icterus.  Neck: Normal range of motion. Neck supple. No JVD present. No tracheal deviation present.  Cardiovascular: Normal rate, regular rhythm, normal heart sounds and intact distal pulses.  Exam reveals no gallop and no friction rub.   No murmur heard. Pulmonary/Chest: Effort normal and breath sounds normal. No stridor. No respiratory distress. She has no wheezes. She has no rales. She exhibits no tenderness.  Abdominal: Soft. Bowel sounds are normal. She exhibits no distension and no mass. There is no tenderness. There is no guarding.    Musculoskeletal: Normal range of motion. She exhibits no edema.  Lymphadenopathy:    She has no cervical adenopathy.  Neurological: She is alert and oriented to person, place, and time. She exhibits normal muscle tone. Coordination normal.  Skin: Skin is warm and dry. No rash noted. She is not diaphoretic. No erythema. No pallor.  Psychiatric: She has a normal mood and affect. Her behavior is normal. Judgment and thought content normal.  Nursing note and vitals reviewed.    ED Treatments / Results  Labs (all labs ordered are listed, but only abnormal results are displayed) Labs Reviewed - No data to display  EKG  EKG Interpretation None       Radiology Dg Chest 2 View  Result Date: 02/17/2016 CLINICAL DATA:  Left-sided chest pain EXAM: CHEST  2 VIEW COMPARISON:  01/29/2016 FINDINGS: The heart size appears normal. No pleural effusion or edema. No airspace consolidation identified. The visualized osseous structures are unremarkable. IMPRESSION: No active cardiopulmonary disease. Electronically Signed   By: Signa Kellaylor  Stroud M.D.   On: 02/17/2016 22:30    Procedures Procedures (including critical care time)  Medications Ordered in ED Medications  ondansetron (ZOFRAN-ODT) disintegrating tablet 4 mg (4 mg Oral Given 02/18/16 1317)  diphenhydrAMINE (BENADRYL) capsule 25 mg (25 mg Oral Given 02/18/16 1317)  predniSONE (DELTASONE) tablet 60 mg (60 mg Oral Given 02/18/16 1315)  famotidine (PEPCID) tablet 20 mg (20 mg Oral Given 02/18/16 1315)     Initial Impression / Assessment and Plan / ED Course  I have reviewed the triage vital signs and the nursing notes.  Pertinent labs & imaging results that were available during my care of the patient were reviewed by me and considered in my medical decision making (see chart for details).  Clinical Course   Pt returns to the ER with reported allergic reaction to naproxen, given to her in the ED last night, after negative work up for  CP.  Pt is well appearing, VSS.  PE unremarkable.  PT given zofran, prednisone, pepcid, benadryl PO, tolering fluids.  On monitor while in ED, VSS.  Can go home with zofran and benadryl PRN.  Advised to avoid aleve and naproxen.  Patient re-evaluated prior to dc, is hemodynamically stable, in no respiratory distress, and denies the feeling of throat closing. Pt has been advised to take OTC benadryl & return to the ED if they have a mod-severe allergic rxn (s/s including throat closing, difficulty breathing, swelling of lips face or tongue). Pt is to follow up with their PCP. Pt is agreeable with plan & verbalizes understanding.   Final Clinical Impressions(s) / ED Diagnoses   Final diagnoses:  Allergic reaction caused by a drug    New Prescriptions New Prescriptions   ONDANSETRON (ZOFRAN ODT) 4 MG DISINTEGRATING TABLET    Take 1 tablet (4 mg total) by  mouth every 8 (eight) hours as needed for nausea or vomiting.     Danelle Berry, PA-C 02/18/16 1406    Vanetta Mulders, MD 02/20/16 [phone]
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
## 2016-02-18 NOTE — ED Triage Notes (Signed)
Pt seen here last night and given Naproxen last night, pt then had a similsr reaction like she does when she takes aleve. Chills, numbness, nausea.

## 2016-02-18 NOTE — ED Triage Notes (Signed)
Pt then adds headache to list of complaints

## 2016-11-06 ENCOUNTER — Encounter (HOSPITAL_COMMUNITY): Payer: Self-pay | Admitting: Emergency Medicine

## 2016-11-06 ENCOUNTER — Ambulatory Visit (HOSPITAL_COMMUNITY)
Admission: EM | Admit: 2016-11-06 | Discharge: 2016-11-06 | Disposition: A | Discharge status: Home / Self Care | Payer: Self-pay | Attending: Family Medicine | Admitting: Family Medicine

## 2016-11-06 DIAGNOSIS — M722 Plantar fascial fibromatosis: Secondary | ICD-10-CM

## 2016-11-06 MED ORDER — IBUPROFEN 800 MG PO TABS: 800.0000 mg | ORAL_TABLET | Freq: Three times a day (TID) | ORAL | 0 refills | Status: AC

## 2016-11-06 NOTE — ED Provider Notes (Signed)
CSN: [id number]     Arrival date & time 11/06/16  1706 History   None    Chief Complaint  Patient presents with  . Foot Pain    51 yo female comes in with 2 day history of bilateral foot/heel pain, right > left. Patient states that pain is worse in the morning, and after inactivity, although pain is constant throughout the day. She takes ibuprofen on and off for pain with minimal relief. She has noticed some swelling. Painful to stand and walk. Denies erythema, increased warmth, fever. Her job requires her to stand for 4-6hrs per shift, and does wear shoes occasionally that has more arch support. Denies injury/trauma, numbness, tingling. No family or personal history of diabetes.       History reviewed. No pertinent past medical history. Past Surgical History:  Procedure Laterality Date  . HERNIA REPAIR     as a child   Family History  Problem Relation Age of Onset  . Hypertension Other    Social History  Substance Use Topics  . Smoking status: Former Smoker    Packs/day: 0.50    Types: Cigarettes  . Smokeless tobacco: Never Used  . Alcohol use Yes     Comment: occ   OB History    No data available     Review of Systems  Constitutional: Negative for chills, diaphoresis and fever.  Respiratory: Negative for chest tightness, shortness of breath and wheezing.   Cardiovascular: Negative for chest pain and palpitations.  Musculoskeletal: Positive for arthralgias, joint swelling and myalgias.  Neurological: Negative for syncope, weakness and headaches.    Allergies  Aleve [naproxen sodium]  Home Medications   Prior to Admission medications   Medication Sig Start Date End Date Taking? Authorizing Provider  albuterol (PROVENTIL HFA;VENTOLIN HFA) 108 (90 BASE) MCG/ACT inhaler Inhale 2 puffs into the lungs every 4 (four) hours as needed for wheezing or shortness of breath. 05/18/14   Cathren LaineSteinl, Kevin, MD  benzonatate (TESSALON) 100 MG capsule Take 1 capsule (100 mg total) by  mouth every 8 (eight) hours. Patient not taking: Reported on 01/30/2016 09/27/15   Audry PiliMohr, Tyler, PA-C  ibuprofen (ADVIL,MOTRIN) 800 MG tablet Take 1 tablet (800 mg total) by mouth 3 (three) times daily. 11/06/16 11/16/16  Belinda FisherYu, Amy V, PA-C  methocarbamol (ROBAXIN) 500 MG tablet Take 1 tablet (500 mg total) by mouth 2 (two) times daily. 02/18/16   Garlon HatchetSanders, Lisa M, PA-C  naproxen (NAPROSYN) 500 MG tablet Take 1 tablet (500 mg total) by mouth 2 (two) times daily with a meal. 02/18/16   Garlon HatchetSanders, Lisa M, PA-C  ondansetron (ZOFRAN ODT) 4 MG disintegrating tablet Take 1 tablet (4 mg total) by mouth every 8 (eight) hours as needed for nausea or vomiting. 02/18/16   Danelle Berryapia, Leisa, PA-C   Meds Ordered and Administered this Visit  Medications - No data to display  BP 111/68 (BP Location: Right Arm)   Pulse 76   Temp 97.5 F (36.4 C) (Oral)   Resp 14   LMP 10/08/2016   SpO2 98%  No data found.   Physical Exam  Constitutional: She is oriented to person, place, and time. She appears well-developed and well-nourished. No distress.  HENT:  Head: Normocephalic and atraumatic.  Eyes: Conjunctivae are normal. Pupils are equal, round, and reactive to light.  Cardiovascular: Normal rate and regular rhythm.  Exam reveals no gallop and no friction rub.   No murmur heard. Pulmonary/Chest: Effort normal and breath sounds normal.  Musculoskeletal:  Right knee: Normal.       Left knee: Normal.       Feet:  No erythema, increased warmth, wound seen on both feet. Tenderness on palpation of the heel bilaterally, though right significantly greater than left. Pain worsens with palpation of the heel with dorsiflexion of the patient's toes. No bony tenderness of the MTP/DTPs. Full ROM of both feet. Normal strength.   Neurological: She is alert and oriented to person, place, and time.  Skin: Skin is warm and dry.  Psychiatric: She has a normal mood and affect. Her behavior is normal. Judgment normal.    Urgent Care  Course     Procedures (including critical care time)  Labs Review Labs Reviewed - No data to display  Imaging Review No results found.        MDM   1. Plantar fasciitis of right foot   2. Plantar fasciitis of left foot    1. Discussed with patient pain is most likely from inflammation of the plantar fascia. Patient is allergic to Naproxen, but has been taking Ibuprofen on and off for pain without problems. Start Ibuprofen 800mg  TID x 10 days. Discussed with patient the importance of compliance for anti-inflammatory effect.  2. Discussed exercises patient can do to help relieve her pain, including foot roll with a cylinder object, ice compress, calf stretches. Also discussed arch support/ inserts as she stands from hours for her work. Discussed possibly needing additional treatment that is not available here and may need orthopedic referral. Patient expresses understanding.     Belinda Fisher, PA-C 11/06/16 1901

## 2016-11-06 NOTE — ED Triage Notes (Signed)
Pt c/o bilateral foot pain onset 2 days but sts pain on right foot is more severe  Denies inj/trauma   Pain increases w/activity  A&O x4... NAD... Slow gait.

## 2017-05-17 IMAGING — CR DG CHEST 2V
2 series · 2 of 2 positions shown · non-contrast
Comparison: Chest radiograph dated 05/18/2014

CLINICAL DATA: 49-year-old female with shortness of breath

EXAM:
CHEST  2 VIEW

[w chest pa]
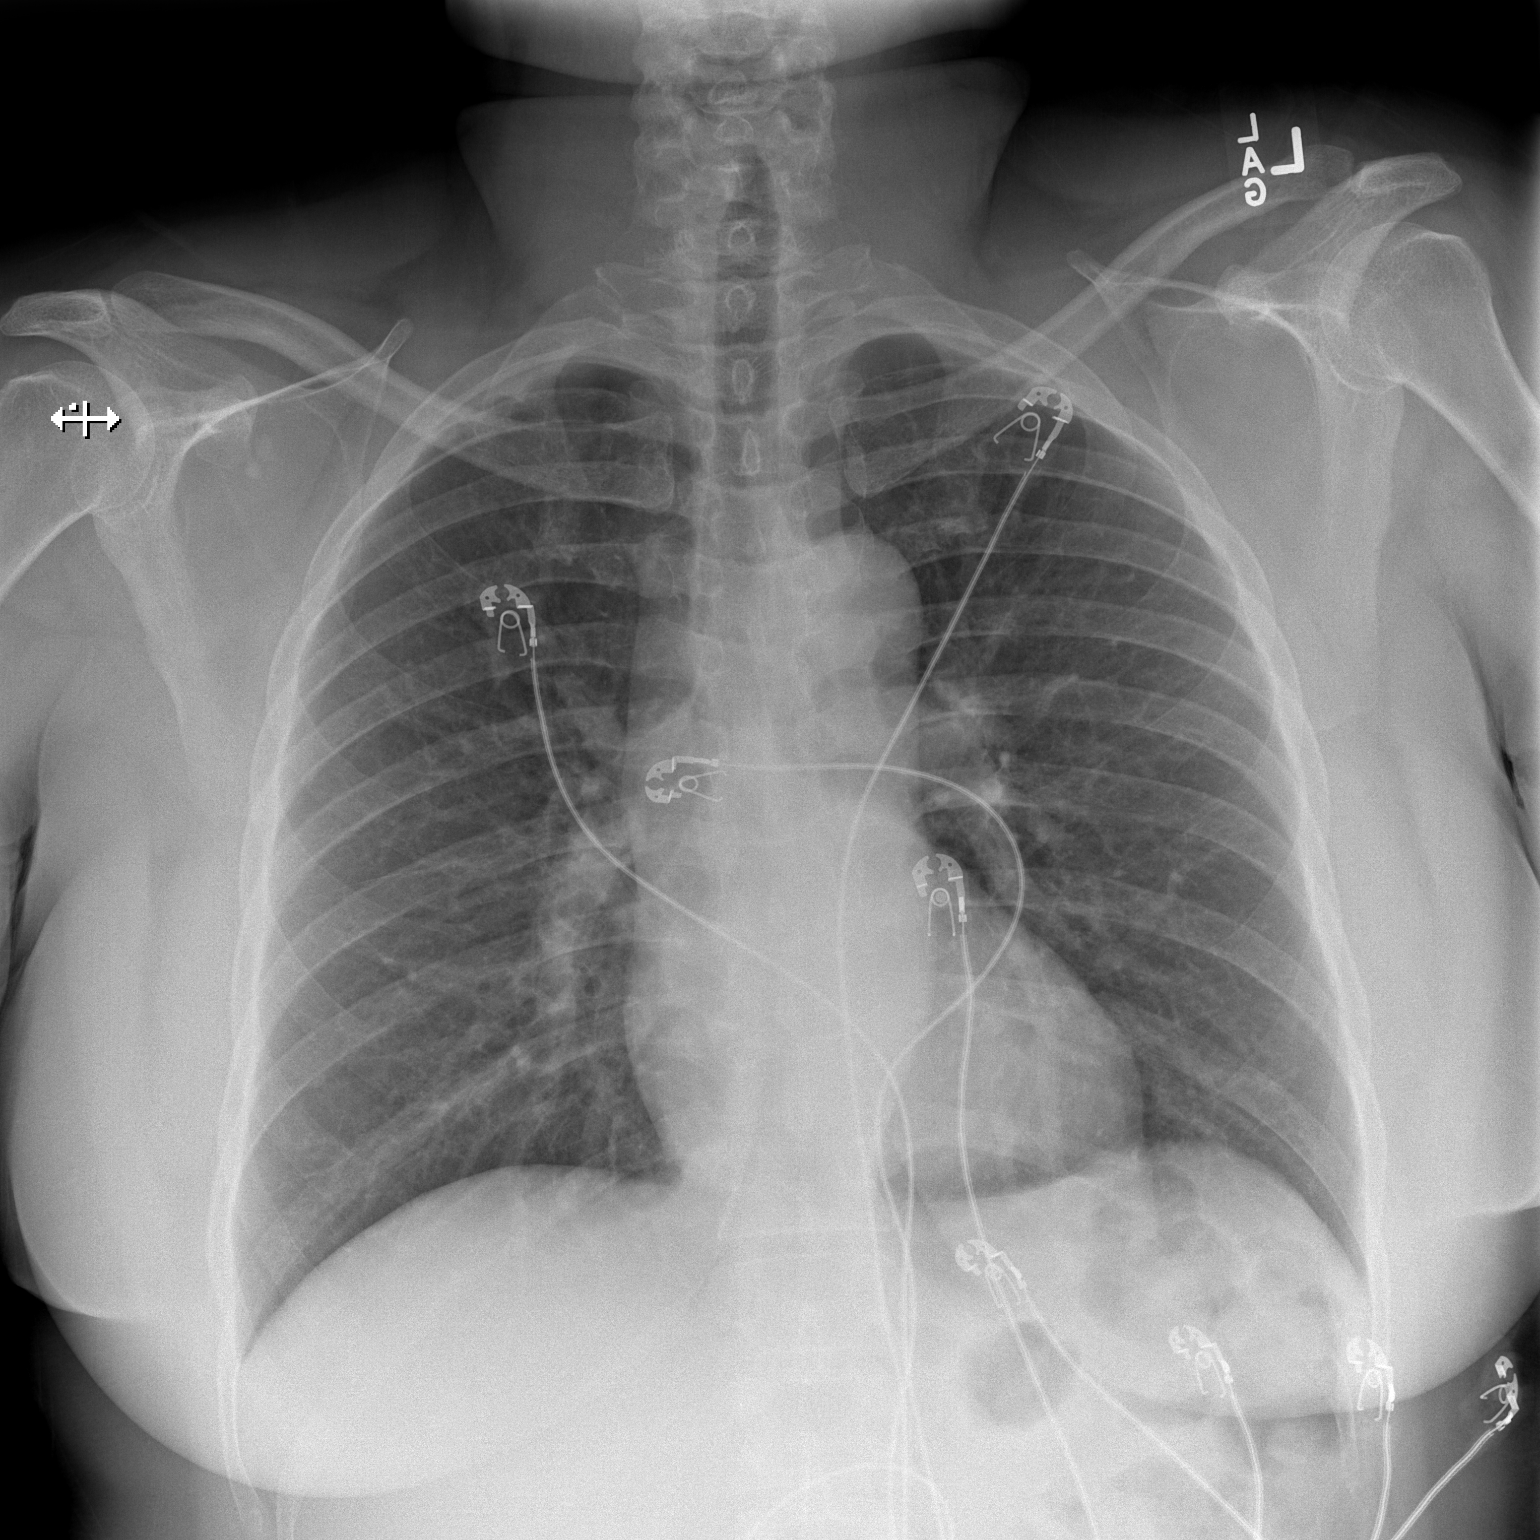

[w chest lat]
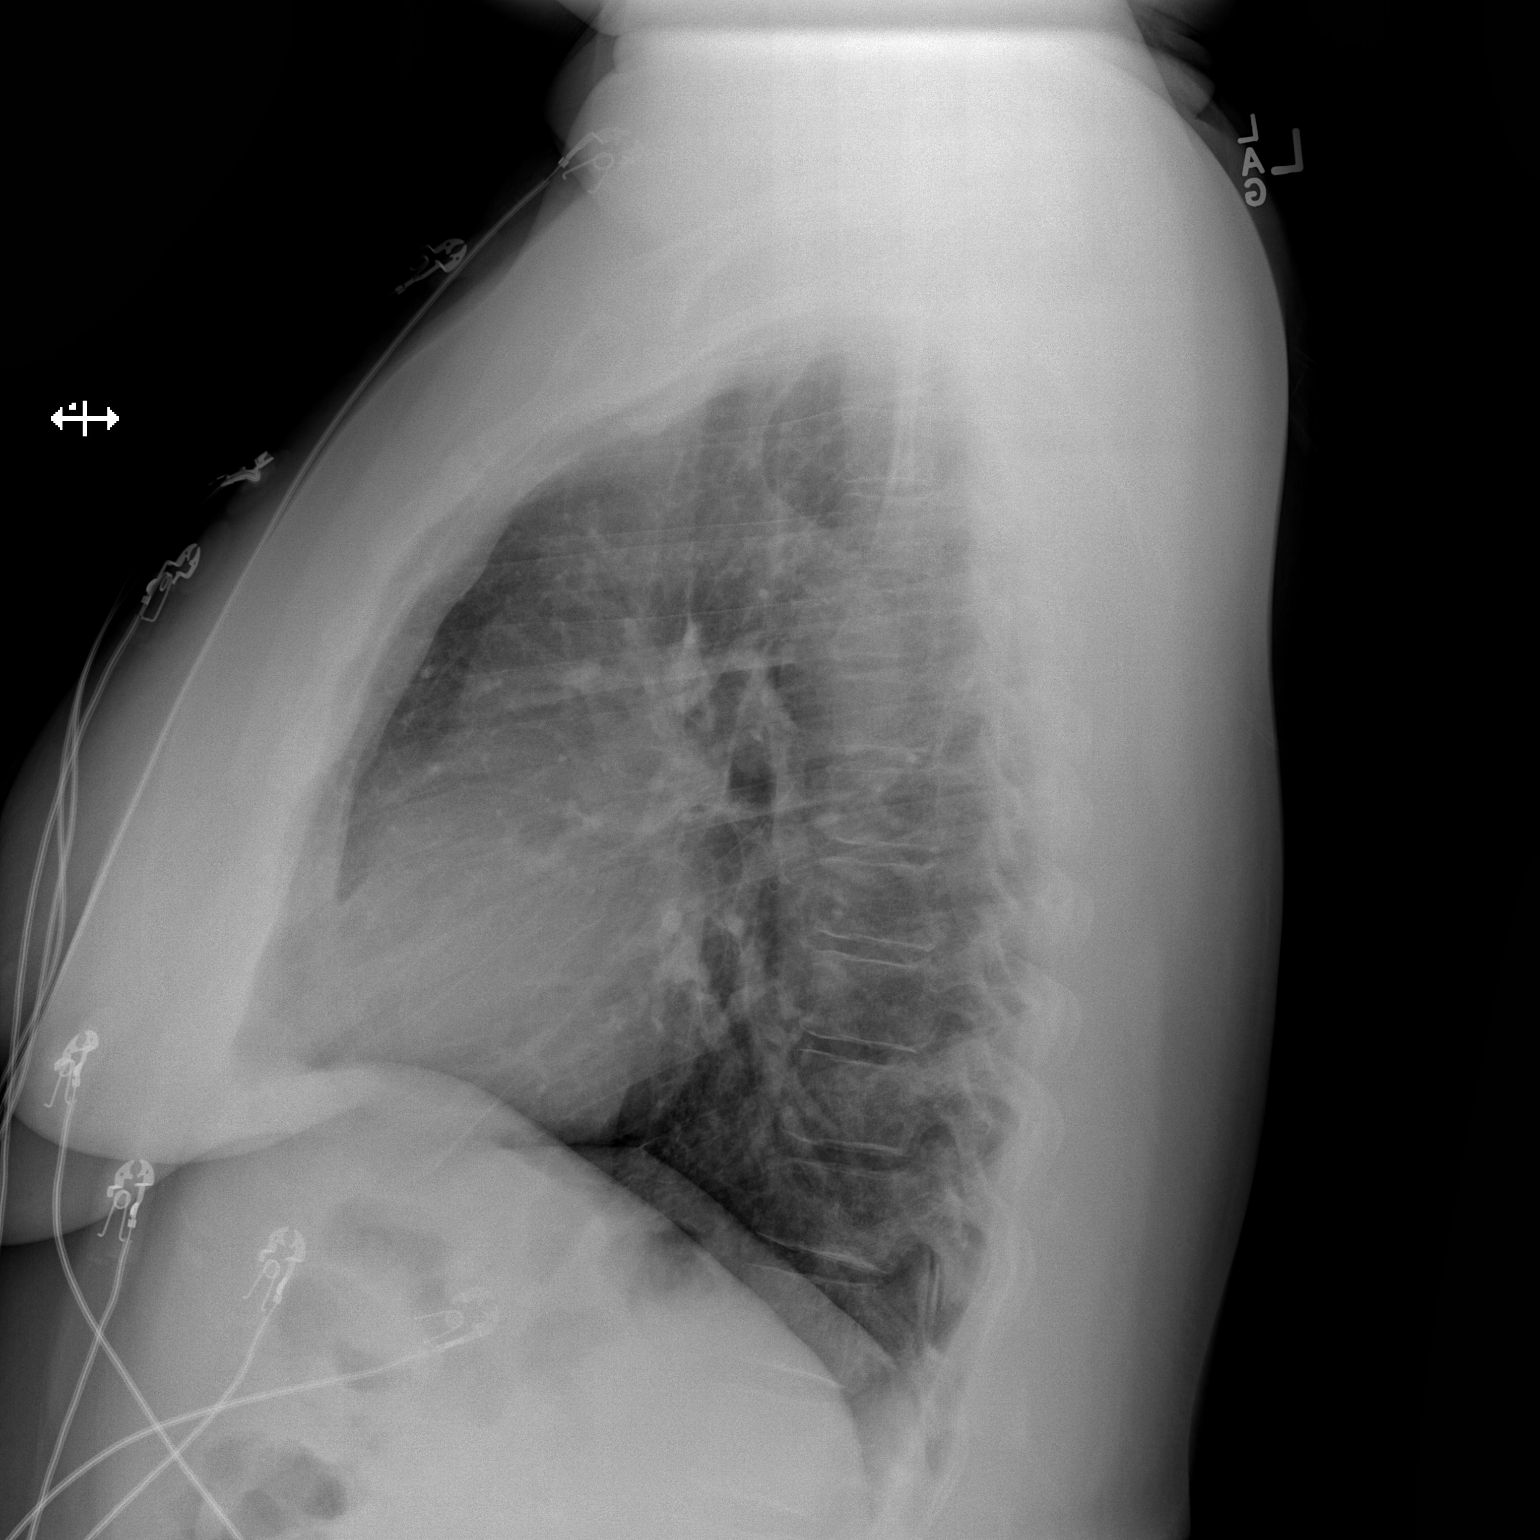

[2 of 2 positions shown; findings below may reference images not displayed]

FINDINGS: The heart size and mediastinal contours are within normal limits.
Both lungs are clear. The visualized skeletal structures are
unremarkable.
IMPRESSION: No active cardiopulmonary disease.

## 2017-05-24 ENCOUNTER — Other Ambulatory Visit: Payer: Self-pay

## 2017-05-24 ENCOUNTER — Encounter (HOSPITAL_COMMUNITY): Payer: Self-pay | Admitting: *Deleted

## 2017-05-24 DIAGNOSIS — Z87891 Personal history of nicotine dependence: Secondary | ICD-10-CM | POA: Insufficient documentation

## 2017-05-24 DIAGNOSIS — Z79899 Other long term (current) drug therapy: Secondary | ICD-10-CM | POA: Diagnosis not present

## 2017-05-24 DIAGNOSIS — M79601 Pain in right arm: Secondary | ICD-10-CM | POA: Diagnosis present

## 2017-05-24 NOTE — ED Triage Notes (Signed)
Pt states for over a week she has had rt arm pain that comes around from her back dawn her arma nd her fingers want to lock. Worse today

## 2017-05-25 ENCOUNTER — Emergency Department (HOSPITAL_COMMUNITY)
Admission: EM | Admit: 2017-05-25 | Discharge: 2017-05-25 | Disposition: A | Discharge status: Home / Self Care | Payer: Medicaid Other | Attending: Emergency Medicine | Admitting: Emergency Medicine

## 2017-05-25 ENCOUNTER — Emergency Department (HOSPITAL_BASED_OUTPATIENT_CLINIC_OR_DEPARTMENT_OTHER): Admit: 2017-05-25 | Discharge: 2017-05-25 | Disposition: A | Discharge status: Home / Self Care | Payer: Medicaid Other

## 2017-05-25 DIAGNOSIS — M79609 Pain in unspecified limb: Secondary | ICD-10-CM

## 2017-05-25 DIAGNOSIS — M79601 Pain in right arm: Secondary | ICD-10-CM

## 2017-05-25 MED ORDER — KETOROLAC TROMETHAMINE 30 MG/ML IJ SOLN
30.0000 mg | Freq: Once | INTRAMUSCULAR | Status: AC
  Administered 2017-05-25: 30 mg via INTRAMUSCULAR
  Filled 2017-05-25: qty 1

## 2017-05-25 MED ORDER — METHOCARBAMOL 500 MG PO TABS: 500.0000 mg | ORAL_TABLET | Freq: Two times a day (BID) | ORAL | 0 refills | Status: DC

## 2017-05-25 MED ORDER — PREDNISONE 50 MG PO TABS: 50.0000 mg | ORAL_TABLET | Freq: Every day | ORAL | 0 refills | Status: DC

## 2017-05-25 NOTE — Discharge Instructions (Signed)
I think that it is possible that your pain could be caused by a pinched nerve either in your neck or your shoulder, which could give you your symptoms.  Continue ibuprofen for pain.  Take Robaxin for muscle spasms. Take prednisone for inflammation as prescribed until all gone. Follow up with family doctor

## 2017-05-25 NOTE — Progress Notes (Signed)
RUE venous duplex prelim: negative for DVT and SVT. Aleira Deiter Eunice, RDMS, RVT  

## 2017-05-25 NOTE — ED Provider Notes (Signed)
[facility]-EMERGENCY DEPT Provider Note   CSN: [id number] Arrival date & time: 05/24/17  2157     History   Chief Complaint Chief Complaint  Patient presents with  . Arm Pain    HPI Colleen Mills is a 52 y.o. female.  HPI Colleen Mills is a 52 y.o. female presents to emergency department complaining of right arm pain and subjective swelling.  States she has had pain to her right arm for about a week.  She states it is gradually getting worse.  She states pain starts in the shoulder area and radiates all the way down to the fingers.  She states is mainly in the last 3 fingers.  She describes pain as dull and achy.  She denies any prior similar pain.  She denies any pain in her neck.  She denies any injuries.  She denies any numbness or tingling in her hand or arm.  She denies any weakness in that hand.  States she has been reading stuff on a Internet and just wants to make sure it is not a blood clot or any other abnormality.  She states she is very anxious because her best friend just passed away from a heart attack unexpectedly.  Patient denies any chest pain.  History reviewed. No pertinent past medical history.  There are no active problems to display for this patient.   Past Surgical History:  Procedure Laterality Date  . HERNIA REPAIR     as a child    OB History    No data available       Home Medications    Prior to Admission medications   Medication Sig Start Date End Date Taking? Authorizing Provider  albuterol (PROVENTIL HFA;VENTOLIN HFA) 108 (90 BASE) MCG/ACT inhaler Inhale 2 puffs into the lungs every 4 (four) hours as needed for wheezing or shortness of breath. 05/18/14   Cathren Laine, MD  benzonatate (TESSALON) 100 MG capsule Take 1 capsule (100 mg total) by mouth every 8 (eight) hours. Patient not taking: Reported on 01/30/2016 09/27/15   Audry Pili, PA-C  methocarbamol (ROBAXIN) 500 MG tablet Take 1 tablet (500 mg total) by mouth 2  (two) times daily. 02/18/16   Garlon Hatchet, PA-C  naproxen (NAPROSYN) 500 MG tablet Take 1 tablet (500 mg total) by mouth 2 (two) times daily with a meal. 02/18/16   Garlon Hatchet, PA-C  ondansetron (ZOFRAN ODT) 4 MG disintegrating tablet Take 1 tablet (4 mg total) by mouth every 8 (eight) hours as needed for nausea or vomiting. 02/18/16   Danelle Berry, PA-C    Family History Family History  Problem Relation Age of Onset  . Hypertension Other     Social History Social History   Tobacco Use  . Smoking status: Former Smoker    Packs/day: 0.50    Types: Cigarettes  . Smokeless tobacco: Never Used  Substance Use Topics  . Alcohol use: Yes    Comment: occ  . Drug use: No     Allergies   Aleve [naproxen sodium]   Review of Systems Review of Systems  Constitutional: Negative for chills and fever.  Respiratory: Negative for cough, chest tightness and shortness of breath.   Cardiovascular: Negative for chest pain, palpitations and leg swelling.  Gastrointestinal: Negative for abdominal pain, diarrhea, nausea and vomiting.  Musculoskeletal: Positive for arthralgias and myalgias. Negative for neck pain and neck stiffness.  Skin: Negative for rash.  Neurological: Negative for dizziness, weakness, numbness and  headaches.  All other systems reviewed and are negative.    Physical Exam Updated Vital Signs BP 125/82 (BP Location: Left Arm)   Pulse 75   Temp 98.3 F (36.8 C) (Oral)   Resp 16   Ht 5\' 9"  (1.753 m)   Wt 115.7 kg (255 lb)   SpO2 96%   BMI 37.66 kg/m   Physical Exam  Constitutional: She appears well-developed and well-nourished. No distress.  HENT:  Head: Normocephalic.  Eyes: Conjunctivae are normal.  Neck: Neck supple.  Cardiovascular: Normal rate, regular rhythm and normal heart sounds.  Pulmonary/Chest: Effort normal and breath sounds normal. No respiratory distress. She has no wheezes. She has no rales.  Abdominal: Soft. Bowel sounds are normal. She  exhibits no distension. There is no tenderness. There is no rebound.  Musculoskeletal: She exhibits no edema.  Normal-appearing right arm and hand with no obvious to me swelling.  Distal radial pulse intact.  Hand is pink, warm.  Capillary refill less than 2 seconds distally.  Full range of motion of the right shoulder, elbow, wrist.  Grip strength is 5 out of 5, grip strength are equal bilaterally.  Sensation intact in all dermatomes of the arm and hand.  Neurological: She is alert.  Skin: Skin is warm and dry.  Psychiatric: She has a normal mood and affect. Her behavior is normal.  Nursing note and vitals reviewed.    ED Treatments / Results  Labs (all labs ordered are listed, but only abnormal results are displayed) Labs Reviewed - No data to display  EKG  EKG Interpretation None       Radiology No results found.  Procedures Procedures (including critical care time)  Medications Ordered in ED Medications  ketorolac (TORADOL) 30 MG/ML injection 30 mg (not administered)     Initial Impression / Assessment and Plan / ED Course  I have reviewed the triage vital signs and the nursing notes.  Pertinent labs & imaging results that were available during my care of the patient were reviewed by me and considered in my medical decision making (see chart for details).     Patient with atraumatic pain and achiness to the right arm.  Exam unremarkable, neurovascularly intact.  Distal pulses are intact.  Strength and sensation intact.  Patient is very anxious and concerned.  I will get venous duplex.  Otherwise her pain is most likely radicular.  Will treat with NSAIDs, muscle relaxants, steroids.  9:34 AM Patient's venous duplex is negative.  Again I think that patient's symptoms are radicular.  She does not have any bony tenderness or pain in the joints.  There is no pain anywhere else in the body.  Soft tissue appears to be unremarkable.  No infection for cellulitis or deep tissue  infection.  We will start her on Robaxin, NSAIDs, prednisone.  We will have her follow-up with her family doctor.  Return precautions discussed.  Vitals:   05/24/17 2220 05/24/17 2226 05/25/17 0520 05/25/17 0809  BP: (!) 132/99  125/82 (!) 131/101  Pulse:   75 63  Resp: 18  16 17   Temp: 98.6 F (37 C)  98.3 F (36.8 C)   TempSrc: Oral  Oral   SpO2: 100%  96% 95%  Weight:  115.7 kg (255 lb)    Height:  5\' 9"  (1.753 m)       Final Clinical Impressions(s) / ED Diagnoses   Final diagnoses:  Right arm pain    ED Discharge Orders  None       Jaleil Renwick, TatyanJaynie Crumblea, PA-C 05/25/17 [id number]    Gwyneth SproutPlunkett, Whitney, MD 05/26/17 2120

## 2017-05-30 DIAGNOSIS — M5412 Radiculopathy, cervical region: Secondary | ICD-10-CM | POA: Diagnosis not present

## 2017-05-30 DIAGNOSIS — Z87891 Personal history of nicotine dependence: Secondary | ICD-10-CM | POA: Insufficient documentation

## 2017-05-30 DIAGNOSIS — M79601 Pain in right arm: Secondary | ICD-10-CM | POA: Diagnosis present

## 2017-05-30 DIAGNOSIS — Z79899 Other long term (current) drug therapy: Secondary | ICD-10-CM | POA: Insufficient documentation

## 2017-05-31 ENCOUNTER — Emergency Department (HOSPITAL_COMMUNITY)
Admission: EM | Admit: 2017-05-31 | Discharge: 2017-05-31 | Disposition: A | Discharge status: Home / Self Care | Payer: Medicaid Other | Attending: Emergency Medicine | Admitting: Emergency Medicine

## 2017-05-31 ENCOUNTER — Encounter (HOSPITAL_COMMUNITY): Payer: Self-pay | Admitting: Emergency Medicine

## 2017-05-31 DIAGNOSIS — M5412 Radiculopathy, cervical region: Secondary | ICD-10-CM

## 2017-05-31 MED ORDER — HYDROCODONE-ACETAMINOPHEN 5-325 MG PO TABS: 1.0000 | ORAL_TABLET | ORAL | 0 refills | Status: DC | PRN

## 2017-05-31 MED ORDER — MORPHINE SULFATE (PF) 4 MG/ML IV SOLN
4.0000 mg | Freq: Once | INTRAVENOUS | Status: AC
  Administered 2017-05-31: 4 mg via INTRAMUSCULAR
  Filled 2017-05-31: qty 1

## 2017-05-31 MED ORDER — DIAZEPAM 5 MG PO TABS
5.0000 mg | ORAL_TABLET | Freq: Once | ORAL | Status: AC
  Administered 2017-05-31: 5 mg via ORAL
  Filled 2017-05-31: qty 1

## 2017-05-31 MED ORDER — OXYCODONE-ACETAMINOPHEN 5-325 MG PO TABS
1.0000 | ORAL_TABLET | ORAL | Status: AC | PRN
  Administered 2017-05-31 (×2): 1 via ORAL
  Filled 2017-05-31 (×2): qty 1

## 2017-05-31 MED ORDER — DIAZEPAM 5 MG PO TABS: 5.0000 mg | ORAL_TABLET | Freq: Two times a day (BID) | ORAL | 0 refills | Status: DC

## 2017-05-31 NOTE — ED Triage Notes (Signed)
Patient here from home with complaints of right arm pain x2 weeks. Pain is constant and radiated down her arm and fingers. Seen last week for same. Prescribed muscle relaxants with no relief.

## 2017-05-31 NOTE — ED Provider Notes (Signed)
[facility]-EMERGENCY DEPT Provider Note   CSN: [id number] Arrival date & time: 05/30/17  2322     History   Chief Complaint Chief Complaint  Patient presents with  . Arm Pain    HPI Colleen Mills is a 52 y.o. female.  Pt presents to the ED today with right arm pain.  The pt was seen here on 1/3 for the same.  She had a cardiac work up and an [place].  She was diagnosed with cervical radiculopathy and was d/c home on robaxin, nsaids, prednisone.  Pt said pain is worsening.      History reviewed. No pertinent past medical history.  There are no active problems to display for this patient.   Past Surgical History:  Procedure Laterality Date  . HERNIA REPAIR     as a child    OB History    No data available       Home Medications    Prior to Admission medications   Medication Sig Start Date End Date Taking? Authorizing Provider  albuterol (PROVENTIL HFA;VENTOLIN HFA) 108 (90 BASE) MCG/ACT inhaler Inhale 2 puffs into the lungs every 4 (four) hours as needed for wheezing or shortness of breath. 05/18/14  Yes Cathren Laine, MD  benzonatate (TESSALON) 100 MG capsule Take 1 capsule (100 mg total) by mouth every 8 (eight) hours. Patient not taking: Reported on 01/30/2016 09/27/15   Audry Pili, PA-C  diazepam (VALIUM) 5 MG tablet Take 1 tablet (5 mg total) by mouth 2 (two) times daily. 05/31/17   Jacalyn Lefevre, MD  HYDROcodone-acetaminophen (NORCO/VICODIN) 5-325 MG tablet Take 1 tablet by mouth every 4 (four) hours as needed. 05/31/17   Jacalyn Lefevre, MD  methocarbamol (ROBAXIN) 500 MG tablet Take 1 tablet (500 mg total) by mouth 2 (two) times daily. Patient not taking: Reported on 05/31/2017 05/25/17   Jaynie Crumble, PA-C  ondansetron (ZOFRAN ODT) 4 MG disintegrating tablet Take 1 tablet (4 mg total) by mouth every 8 (eight) hours as needed for nausea or vomiting. Patient not taking: Reported on 05/25/2017 02/18/16   Danelle Berry, PA-C  predniSONE  (DELTASONE) 50 MG tablet Take 1 tablet (50 mg total) by mouth daily. Patient not taking: Reported on 05/31/2017 05/25/17   Jaynie Crumble, PA-C    Family History Family History  Problem Relation Age of Onset  . Hypertension Other     Social History Social History   Tobacco Use  . Smoking status: Former Smoker    Packs/day: 0.50    Types: Cigarettes  . Smokeless tobacco: Never Used  Substance Use Topics  . Alcohol use: Yes    Comment: occ  . Drug use: No     Allergies   Aleve [naproxen sodium]   Review of Systems Review of Systems  Musculoskeletal:       Right arm pain  All other systems reviewed and are negative.    Physical Exam Updated Vital Signs BP (!) 133/96   Pulse 66   Temp 98.5 F (36.9 C) (Oral)   Resp 16   SpO2 99%   Physical Exam  Constitutional: She is oriented to person, place, and time. She appears well-developed and well-nourished.  HENT:  Head: Normocephalic and atraumatic.  Right Ear: External ear normal.  Left Ear: External ear normal.  Nose: Nose normal.  Mouth/Throat: Oropharynx is clear and moist.  Eyes: Conjunctivae and EOM are normal. Pupils are equal, round, and reactive to light.  Neck: Normal range of motion. Neck supple.  Cardiovascular: Normal rate, regular rhythm, normal heart sounds and intact distal pulses.  Pulmonary/Chest: Effort normal and breath sounds normal.  Abdominal: Soft. Bowel sounds are normal.  Musculoskeletal:  Pt has good rom in right arm.  Pulses intact.  Neurological: She is alert and oriented to person, place, and time.  Skin: Skin is warm and dry. Capillary refill takes less than 2 seconds.  Psychiatric: She has a normal mood and affect. Her behavior is normal. Judgment and thought content normal.  Nursing note and vitals reviewed.    ED Treatments / Results  Labs (all labs ordered are listed, but only abnormal results are displayed) Labs Reviewed - No data to display  EKG  EKG  Interpretation None       Radiology No results found.  Procedures Procedures (including critical care time)  Medications Ordered in ED Medications  oxyCODONE-acetaminophen (PERCOCET/ROXICET) 5-325 MG per tablet 1 tablet (1 tablet Oral Given 05/31/17 0530)  morphine 4 MG/ML injection 4 mg (4 mg Intramuscular Given 05/31/17 0601)  diazepam (VALIUM) tablet 5 mg (5 mg Oral Given 05/31/17 0601)     Initial Impression / Assessment and Plan / ED Course  I have reviewed the triage vital signs and the nursing notes.  Pertinent labs & imaging results that were available during my care of the patient were reviewed by me and considered in my medical decision making (see chart for details).    Pt is feeling much better.  She has a lot of spasm in her right arm, so the valium should help with that.  Pt is stable for d/c.  Return if worse and establish primary care with [place] [facility].  Final Clinical Impressions(s) / ED Diagnoses   Final diagnoses:  Cervical radiculopathy    ED Discharge Orders        Ordered    diazepam (VALIUM) 5 MG tablet  2 times daily     05/31/17 0619    HYDROcodone-acetaminophen (NORCO/VICODIN) 5-325 MG tablet  Every 4 hours PRN     05/31/17 [id number]       Jacalyn LefevreHaviland, Shawndell Varas, MD 05/31/17 [phone]

## 2017-07-27 ENCOUNTER — Ambulatory Visit: Payer: Medicaid Other | Admitting: Podiatry

## 2017-08-03 ENCOUNTER — Ambulatory Visit: Payer: Medicaid Other

## 2017-08-03 ENCOUNTER — Encounter: Payer: Self-pay | Admitting: Podiatry

## 2017-08-03 ENCOUNTER — Ambulatory Visit (INDEPENDENT_AMBULATORY_CARE_PROVIDER_SITE_OTHER): Payer: Medicaid Other

## 2017-08-03 ENCOUNTER — Ambulatory Visit (INDEPENDENT_AMBULATORY_CARE_PROVIDER_SITE_OTHER): Payer: Medicaid Other | Admitting: Podiatry

## 2017-08-03 VITALS — BP 111/71 | HR 76 | Resp 16

## 2017-08-03 DIAGNOSIS — M722 Plantar fascial fibromatosis: Secondary | ICD-10-CM | POA: Diagnosis not present

## 2017-08-03 DIAGNOSIS — M7751 Other enthesopathy of right foot: Secondary | ICD-10-CM

## 2017-08-03 DIAGNOSIS — M7752 Other enthesopathy of left foot: Secondary | ICD-10-CM | POA: Diagnosis not present

## 2017-08-03 DIAGNOSIS — M779 Enthesopathy, unspecified: Secondary | ICD-10-CM

## 2017-08-03 DIAGNOSIS — M21619 Bunion of unspecified foot: Secondary | ICD-10-CM

## 2017-08-03 MED ORDER — TRIAMCINOLONE ACETONIDE 10 MG/ML IJ SUSP
10.0000 mg | Freq: Once | INTRAMUSCULAR | Status: AC
  Administered 2017-08-03: 10 mg

## 2017-08-03 NOTE — Progress Notes (Signed)
Subjective:   Patient ID: Colleen Mills, female   DOB: 52 y.o.   MRN: [id number]   HPI Patient presents with significant heel pain 1 year duration bilateral stating that she does not remember specific beginning to it and also has bunion deformity which are painful bilateral.  States she is tried wider shoes and support and has not been able to achieve relief of symptoms and patient currently does not smoke and likes to be active   Review of Systems  All other systems reviewed and are negative.       Objective:  Physical Exam  Constitutional: She appears well-developed and well-nourished.  Cardiovascular: Intact distal pulses.  Pulmonary/Chest: Effort normal.  Musculoskeletal: Normal range of motion.  Neurological: She is alert.  Skin: Skin is warm.  Nursing note and vitals reviewed.   Neurovascular status found to be intact muscle strength adequate range of motion within normal limits.  Patient is found to have exquisite discomfort plantar aspect heel region bilateral with inflammation fluid around the medial band and hyperostosis of the medial aspect first metatarsal head bilateral with redness around the joint surfaces and pain with palpation.  Mild posterior pain but nowhere near to the degree of the plantar pain was noted     Assessment:  Acute plantar fasciitis bilateral with moderate depression of the arch and structural bunion deformity bilateral     Plan:  H&P and all conditions reviewed.  At this point we will get a focus on the heels with long-term structural bunion correction which would be necessary.  I did inject the plantar fascial bilateral 3 mg Kenalog 5 mg Xylocaine and instructed on supportive shoes and physical therapy and will be seen back to recheck again in 2 weeks  X-rays indicate that there is moderate plantar spur formation and structural elevation of the intermetatarsal angle between the first and second metatarsal bilateral

## 2017-08-03 NOTE — Patient Instructions (Addendum)
Achilles Tendinitis  with Rehab Achilles tendinitis is a disorder of the Achilles tendon. The Achilles tendon connects the large calf muscles (Gastrocnemius and Soleus) to the heel bone (calcaneus). This tendon is sometimes called the heel cord. It is important for pushing-off and standing on your toes and is important for walking, running, or jumping. Tendinitis is often caused by overuse and repetitive microtrauma. SYMPTOMS  Pain, tenderness, swelling, warmth, and redness may occur over the Achilles tendon even at rest.  Pain with pushing off, or flexing or extending the ankle.  Pain that is worsened after or during activity. CAUSES   Overuse sometimes seen with rapid increase in exercise programs or in sports requiring running and jumping.  Poor physical conditioning (strength and flexibility or endurance).  Running sports, especially training running down hills.  Inadequate warm-up before practice or play or failure to stretch before participation.  Injury to the tendon. PREVENTION   Warm up and stretch before practice or competition.  Allow time for adequate rest and recovery between practices and competition.  Keep up conditioning.  Keep up ankle and leg flexibility.  Improve or keep muscle strength and endurance.  Improve cardiovascular fitness.  Use proper technique.  Use proper equipment (shoes, skates).  To help prevent recurrence, taping, protective strapping, or an adhesive bandage may be recommended for several weeks after healing is complete. PROGNOSIS   Recovery may take weeks to several months to heal.  Longer recovery is expected if symptoms have been prolonged.  Recovery is usually quicker if the inflammation is due to a direct blow as compared with overuse or sudden strain. RELATED COMPLICATIONS   Healing time will be prolonged if the condition is not correctly treated. The injury must be given plenty of time to heal.  Symptoms can reoccur if  activity is resumed too soon.  Untreated, tendinitis may increase the risk of tendon rupture requiring additional time for recovery and possibly surgery. TREATMENT   The first treatment consists of rest anti-inflammatory medication, and ice to relieve the pain.  Stretching and strengthening exercises after resolution of pain will likely help reduce the risk of recurrence. Referral to a physical therapist or athletic trainer for further evaluation and treatment may be helpful.  A walking boot or cast may be recommended to rest the Achilles tendon. This can help break the cycle of inflammation and microtrauma.  Arch supports (orthotics) may be prescribed or recommended by your caregiver as an adjunct to therapy and rest.  Surgery to remove the inflamed tendon lining or degenerated tendon tissue is rarely necessary and has shown less than predictable results. MEDICATION   Nonsteroidal anti-inflammatory medications, such as aspirin and ibuprofen, may be used for pain and inflammation relief. Do not take within 7 days before surgery. Take these as directed by your caregiver. Contact your caregiver immediately if any bleeding, stomach upset, or signs of allergic reaction occur. Other minor pain relievers, such as acetaminophen, may also be used.  Pain relievers may be prescribed as necessary by your caregiver. Do not take prescription pain medication for longer than 4 to 7 days. Use only as directed and only as much as you need.  Cortisone injections are rarely indicated. Cortisone injections may weaken tendons and predispose to rupture. It is better to give the condition more time to heal than to use them. HEAT AND COLD  Cold is used to relieve pain and reduce inflammation for acute and chronic Achilles tendinitis. Cold should be applied for 10 to 15 minutes   every 2 to 3 hours for inflammation and pain and immediately after any activity that aggravates your symptoms. Use ice packs or an ice  massage.  Heat may be used before performing stretching and strengthening activities prescribed by your caregiver. Use a heat pack or a warm soak. SEEK MEDICAL CARE IF:  Symptoms get worse or do not improve in 2 weeks despite treatment.  New, unexplained symptoms develop. Drugs used in treatment may produce side effects.  EXERCISES:  RANGE OF MOTION (ROM) AND STRETCHING EXERCISES - Achilles Tendinitis  These exercises may help you when beginning to rehabilitate your injury. Your symptoms may resolve with or without further involvement from your physician, physical therapist or athletic trainer. While completing these exercises, remember:   Restoring tissue flexibility helps normal motion to return to the joints. This allows healthier, less painful movement and activity.  An effective stretch should be held for at least 30 seconds.  A stretch should never be painful. You should only feel a gentle lengthening or release in the stretched tissue.  STRETCH  Gastroc, Standing   Place hands on wall.  Extend right / left leg, keeping the front knee somewhat bent.  Slightly point your toes inward on your back foot.  Keeping your right / left heel on the floor and your knee straight, shift your weight toward the wall, not allowing your back to arch.  You should feel a gentle stretch in the right / left calf. Hold this position for 10 seconds. Repeat 3 times. Complete this stretch 2 times per day.  STRETCH  Soleus, Standing   Place hands on wall.  Extend right / left leg, keeping the other knee somewhat bent.  Slightly point your toes inward on your back foot.  Keep your right / left heel on the floor, bend your back knee, and slightly shift your weight over the back leg so that you feel a gentle stretch deep in your back calf.  Hold this position for 10 seconds. Repeat 3 times. Complete this stretch 2 times per day.  STRETCH  Gastrocsoleus, Standing  Note: This exercise can place  a lot of stress on your foot and ankle. Please complete this exercise only if specifically instructed by your caregiver.   Place the ball of your right / left foot on a step, keeping your other foot firmly on the same step.  Hold on to the wall or a rail for balance.  Slowly lift your other foot, allowing your body weight to press your heel down over the edge of the step.  You should feel a stretch in your right / left calf.  Hold this position for 10 seconds.  Repeat this exercise with a slight bend in your knee. Repeat 3 times. Complete this stretch 2 times per day.   STRENGTHENING EXERCISES - Achilles Tendinitis These exercises may help you when beginning to rehabilitate your injury. They may resolve your symptoms with or without further involvement from your physician, physical therapist or athletic trainer. While completing these exercises, remember:   Muscles can gain both the endurance and the strength needed for everyday activities through controlled exercises.  Complete these exercises as instructed by your physician, physical therapist or athletic trainer. Progress the resistance and repetitions only as guided.  You may experience muscle soreness or fatigue, but the pain or discomfort you are trying to eliminate should never worsen during these exercises. If this pain does worsen, stop and make certain you are following the directions exactly. If   the pain is still present after adjustments, discontinue the exercise until you can discuss the trouble with your clinician.  STRENGTH - Plantar-flexors   Sit with your right / left leg extended. Holding onto both ends of a rubber exercise band/tubing, loop it around the ball of your foot. Keep a slight tension in the band.  Slowly push your toes away from you, pointing them downward.  Hold this position for 10 seconds. Return slowly, controlling the tension in the band/tubing. Repeat 3 times. Complete this exercise 2 times per day.     STRENGTH - Plantar-flexors   Stand with your feet shoulder width apart. Steady yourself with a wall or table using as little support as needed.  Keeping your weight evenly spread over the width of your feet, rise up on your toes.*  Hold this position for 10 seconds. Repeat 3 times. Complete this exercise 2 times per day.  *If this is too easy, shift your weight toward your right / left leg until you feel challenged. Ultimately, you may be asked to do this exercise with your right / left foot only.  STRENGTH  Plantar-flexors, Eccentric  Note: This exercise can place a lot of stress on your foot and ankle. Please complete this exercise only if specifically instructed by your caregiver.   Place the balls of your feet on a step. With your hands, use only enough support from a wall or rail to keep your balance.  Keep your knees straight and rise up on your toes.  Slowly shift your weight entirely to your right / left toes and pick up your opposite foot. Gently and with controlled movement, lower your weight through your right / left foot so that your heel drops below the level of the step. You will feel a slight stretch in the back of your calf at the end position.  Use the healthy leg to help rise up onto the balls of both feet, then lower weight only on the right / left leg again. Build up to 15 repetitions. Then progress to 3 consecutive sets of 15 repetitions.*  After completing the above exercise, complete the same exercise with a slight knee bend (about 30 degrees). Again, build up to 15 repetitions. Then progress to 3 consecutive sets of 15 repetitions.* Perform this exercise 2 times per day.  *When you easily complete 3 sets of 15, your physician, physical therapist or athletic trainer may advise you to add resistance by wearing a backpack filled with additional weight.  STRENGTH - Plantar Flexors, Seated   Sit on a chair that allows your feet to rest flat on the ground. If  necessary, sit at the edge of the chair.  Keeping your toes firmly on the ground, lift your right / left heel as far as you can without increasing any discomfort in your ankle. Repeat 3 times. Complete this exercise 2 times a day. Achilles Tendinitis  with Rehab Achilles tendinitis is a disorder of the Achilles tendon. The Achilles tendon connects the large calf muscles (Gastrocnemius and Soleus) to the heel bone (calcaneus). This tendon is sometimes called the heel cord. It is important for pushing-off and standing on your toes and is important for walking, running, or jumping. Tendinitis is often caused by overuse and repetitive microtrauma. SYMPTOMS  Pain, tenderness, swelling, warmth, and redness may occur over the Achilles tendon even at rest.  Pain with pushing off, or flexing or extending the ankle.  Pain that is worsened after or during activity. CAUSES     Overuse sometimes seen with rapid increase in exercise programs or in sports requiring running and jumping.  Poor physical conditioning (strength and flexibility or endurance).  Running sports, especially training running down hills.  Inadequate warm-up before practice or play or failure to stretch before participation.  Injury to the tendon. PREVENTION   Warm up and stretch before practice or competition.  Allow time for adequate rest and recovery between practices and competition.  Keep up conditioning.  Keep up ankle and leg flexibility.  Improve or keep muscle strength and endurance.  Improve cardiovascular fitness.  Use proper technique.  Use proper equipment (shoes, skates).  To help prevent recurrence, taping, protective strapping, or an adhesive bandage may be recommended for several weeks after healing is complete. PROGNOSIS   Recovery may take weeks to several months to heal.  Longer recovery is expected if symptoms have been prolonged.  Recovery is usually quicker if the inflammation is due to a  direct blow as compared with overuse or sudden strain. RELATED COMPLICATIONS   Healing time will be prolonged if the condition is not correctly treated. The injury must be given plenty of time to heal.  Symptoms can reoccur if activity is resumed too soon.  Untreated, tendinitis may increase the risk of tendon rupture requiring additional time for recovery and possibly surgery. TREATMENT   The first treatment consists of rest anti-inflammatory medication, and ice to relieve the pain.  Stretching and strengthening exercises after resolution of pain will likely help reduce the risk of recurrence. Referral to a physical therapist or athletic trainer for further evaluation and treatment may be helpful.  A walking boot or cast may be recommended to rest the Achilles tendon. This can help break the cycle of inflammation and microtrauma.  Arch supports (orthotics) may be prescribed or recommended by your caregiver as an adjunct to therapy and rest.  Surgery to remove the inflamed tendon lining or degenerated tendon tissue is rarely necessary and has shown less than predictable results. MEDICATION   Nonsteroidal anti-inflammatory medications, such as aspirin and ibuprofen, may be used for pain and inflammation relief. Do not take within 7 days before surgery. Take these as directed by your caregiver. Contact your caregiver immediately if any bleeding, stomach upset, or signs of allergic reaction occur. Other minor pain relievers, such as acetaminophen, may also be used.  Pain relievers may be prescribed as necessary by your caregiver. Do not take prescription pain medication for longer than 4 to 7 days. Use only as directed and only as much as you need.  Cortisone injections are rarely indicated. Cortisone injections may weaken tendons and predispose to rupture. It is better to give the condition more time to heal than to use them. HEAT AND COLD  Cold is used to relieve pain and reduce  inflammation for acute and chronic Achilles tendinitis. Cold should be applied for 10 to 15 minutes every 2 to 3 hours for inflammation and pain and immediately after any activity that aggravates your symptoms. Use ice packs or an ice massage.  Heat may be used before performing stretching and strengthening activities prescribed by your caregiver. Use a heat pack or a warm soak. SEEK MEDICAL CARE IF:  Symptoms get worse or do not improve in 2 weeks despite treatment.  New, unexplained symptoms develop. Drugs used in treatment may produce side effects.  EXERCISES:  RANGE OF MOTION (ROM) AND STRETCHING EXERCISES - Achilles Tendinitis  These exercises may help you when beginning to rehabilitate your injury. Your   symptoms may resolve with or without further involvement from your physician, physical therapist or athletic trainer. While completing these exercises, remember:   Restoring tissue flexibility helps normal motion to return to the joints. This allows healthier, less painful movement and activity.  An effective stretch should be held for at least 30 seconds.  A stretch should never be painful. You should only feel a gentle lengthening or release in the stretched tissue.  STRETCH  Gastroc, Standing   Place hands on wall.  Extend right / left leg, keeping the front knee somewhat bent.  Slightly point your toes inward on your back foot.  Keeping your right / left heel on the floor and your knee straight, shift your weight toward the wall, not allowing your back to arch.  You should feel a gentle stretch in the right / left calf. Hold this position for 10 seconds. Repeat 3 times. Complete this stretch 2 times per day.  STRETCH  Soleus, Standing   Place hands on wall.  Extend right / left leg, keeping the other knee somewhat bent.  Slightly point your toes inward on your back foot.  Keep your right / left heel on the floor, bend your back knee, and slightly shift your weight  over the back leg so that you feel a gentle stretch deep in your back calf.  Hold this position for 10 seconds. Repeat 3 times. Complete this stretch 2 times per day.  STRETCH  Gastrocsoleus, Standing  Note: This exercise can place a lot of stress on your foot and ankle. Please complete this exercise only if specifically instructed by your caregiver.   Place the ball of your right / left foot on a step, keeping your other foot firmly on the same step.  Hold on to the wall or a rail for balance.  Slowly lift your other foot, allowing your body weight to press your heel down over the edge of the step.  You should feel a stretch in your right / left calf.  Hold this position for 10 seconds.  Repeat this exercise with a slight bend in your knee. Repeat 3 times. Complete this stretch 2 times per day.   STRENGTHENING EXERCISES - Achilles Tendinitis These exercises may help you when beginning to rehabilitate your injury. They may resolve your symptoms with or without further involvement from your physician, physical therapist or athletic trainer. While completing these exercises, remember:   Muscles can gain both the endurance and the strength needed for everyday activities through controlled exercises.  Complete these exercises as instructed by your physician, physical therapist or athletic trainer. Progress the resistance and repetitions only as guided.  You may experience muscle soreness or fatigue, but the pain or discomfort you are trying to eliminate should never worsen during these exercises. If this pain does worsen, stop and make certain you are following the directions exactly. If the pain is still present after adjustments, discontinue the exercise until you can discuss the trouble with your clinician.  STRENGTH - Plantar-flexors   Sit with your right / left leg extended. Holding onto both ends of a rubber exercise band/tubing, loop it around the ball of your foot. Keep a slight  tension in the band.  Slowly push your toes away from you, pointing them downward.  Hold this position for 10 seconds. Return slowly, controlling the tension in the band/tubing. Repeat 3 times. Complete this exercise 2 times per day.   STRENGTH - Plantar-flexors   Stand with your feet   shoulder width apart. Steady yourself with a wall or table using as little support as needed.  Keeping your weight evenly spread over the width of your feet, rise up on your toes.*  Hold this position for 10 seconds. Repeat 3 times. Complete this exercise 2 times per day.  *If this is too easy, shift your weight toward your right / left leg until you feel challenged. Ultimately, you may be asked to do this exercise with your right / left foot only.  STRENGTH  Plantar-flexors, Eccentric  Note: This exercise can place a lot of stress on your foot and ankle. Please complete this exercise only if specifically instructed by your caregiver.   Place the balls of your feet on a step. With your hands, use only enough support from a wall or rail to keep your balance.  Keep your knees straight and rise up on your toes.  Slowly shift your weight entirely to your right / left toes and pick up your opposite foot. Gently and with controlled movement, lower your weight through your right / left foot so that your heel drops below the level of the step. You will feel a slight stretch in the back of your calf at the end position.  Use the healthy leg to help rise up onto the balls of both feet, then lower weight only on the right / left leg again. Build up to 15 repetitions. Then progress to 3 consecutive sets of 15 repetitions.*  After completing the above exercise, complete the same exercise with a slight knee bend (about 30 degrees). Again, build up to 15 repetitions. Then progress to 3 consecutive sets of 15 repetitions.* Perform this exercise 2 times per day.  *When you easily complete 3 sets of 15, your physician,  physical therapist or athletic trainer may advise you to add resistance by wearing a backpack filled with additional weight.  STRENGTH - Plantar Flexors, Seated   Sit on a chair that allows your feet to rest flat on the ground. If necessary, sit at the edge of the chair.  Keeping your toes firmly on the ground, lift your right / left heel as far as you can without increasing any discomfort in your ankle. Repeat 3 times. Complete this exercise 2 times a day.  

## 2017-08-17 ENCOUNTER — Encounter: Payer: Self-pay | Admitting: Podiatry

## 2017-08-17 ENCOUNTER — Ambulatory Visit: Payer: Medicaid Other | Admitting: Podiatry

## 2017-08-17 DIAGNOSIS — M7751 Other enthesopathy of right foot: Secondary | ICD-10-CM

## 2017-08-17 DIAGNOSIS — M21619 Bunion of unspecified foot: Secondary | ICD-10-CM

## 2017-08-17 DIAGNOSIS — M779 Enthesopathy, unspecified: Secondary | ICD-10-CM

## 2017-08-17 DIAGNOSIS — M722 Plantar fascial fibromatosis: Secondary | ICD-10-CM | POA: Diagnosis not present

## 2017-08-17 NOTE — Progress Notes (Signed)
Subjective:   Patient ID: Colleen Mills, female   DOB: 52 y.o.   MRN: [id number]   HPI Patient states the heels are quite a bit improved and she is still concerned about her bunions due to the pain and she would like to get on soaks what her schedule allows.   ROS      Objective:  Physical Exam  Neurovascular status intact with several different problems with one being acute plantar fasciitis bilateral and the other being structural bunion deformity left over right with redness and pain around the first metatarsal head     Assessment:  Plantar fascial symptomatology improved along with structural bunion deformity bilateral     Plan:  H&P condition reviewed and at this time discussed bunion correction which can be done in future.  I did go ahead today and I instructed on continued support and physical therapy for the plantar fascia and spent a great deal time educating her on this
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
## 2017-12-28 ENCOUNTER — Ambulatory Visit: Payer: Medicaid Other | Admitting: Podiatry

## 2017-12-31 ENCOUNTER — Ambulatory Visit: Payer: Medicaid Other | Admitting: Podiatry

## 2017-12-31 ENCOUNTER — Encounter: Payer: Self-pay | Admitting: Podiatry

## 2017-12-31 DIAGNOSIS — M722 Plantar fascial fibromatosis: Secondary | ICD-10-CM | POA: Diagnosis not present

## 2017-12-31 MED ORDER — TRIAMCINOLONE ACETONIDE 10 MG/ML IJ SUSP
10.0000 mg | Freq: Once | INTRAMUSCULAR | Status: AC
  Administered 2017-12-31: 10 mg

## 2017-12-31 NOTE — Patient Instructions (Signed)

## 2018-01-11 ENCOUNTER — Encounter (HOSPITAL_COMMUNITY): Payer: Self-pay | Admitting: Emergency Medicine

## 2018-01-11 ENCOUNTER — Emergency Department (HOSPITAL_COMMUNITY)
Admission: EM | Admit: 2018-01-11 | Discharge: 2018-01-11 | Disposition: A | Discharge status: Home / Self Care | Payer: Medicaid Other | Attending: Emergency Medicine | Admitting: Emergency Medicine

## 2018-01-11 ENCOUNTER — Emergency Department (HOSPITAL_COMMUNITY): Payer: Medicaid Other

## 2018-01-11 ENCOUNTER — Other Ambulatory Visit: Payer: Self-pay

## 2018-01-11 DIAGNOSIS — R0602 Shortness of breath: Secondary | ICD-10-CM | POA: Diagnosis not present

## 2018-01-11 DIAGNOSIS — Z87891 Personal history of nicotine dependence: Secondary | ICD-10-CM | POA: Insufficient documentation

## 2018-01-11 DIAGNOSIS — Z79899 Other long term (current) drug therapy: Secondary | ICD-10-CM | POA: Insufficient documentation

## 2018-01-11 LAB — CBC
HCT: 37.6 % (ref 36.0–46.0)
HEMOGLOBIN: 12.5 g/dL (ref 12.0–15.0)
MCH: 30 pg (ref 26.0–34.0)
MCHC: 33.2 g/dL (ref 30.0–36.0)
MCV: 90.2 fL (ref 78.0–100.0)
Platelets: 220 10*3/uL (ref 150–400)
RBC: 4.17 MIL/uL (ref 3.87–5.11)
RDW: 13.5 % (ref 11.5–15.5)
WBC: 7.5 10*3/uL (ref 4.0–10.5)

## 2018-01-11 LAB — BASIC METABOLIC PANEL
ANION GAP: 7 (ref 5–15)
BUN: 11 mg/dL (ref 6–20)
CALCIUM: 10 mg/dL (ref 8.9–10.3)
CO2: 27 mmol/L (ref 22–32)
Chloride: 102 mmol/L (ref 98–111)
Creatinine, Ser: 0.85 mg/dL (ref 0.44–1.00)
GFR calc non Af Amer: >60 mL/min (ref >60)
GLUCOSE: 95 mg/dL (ref 70–99)
Potassium: 3.5 mmol/L (ref 3.5–5.1)
Sodium: 136 mmol/L (ref 135–145)

## 2018-01-11 LAB — I-STAT TROPONIN, ED: TROPONIN I, POC: 0.01 ng/mL (ref 0.00–0.08)

## 2018-01-11 LAB — I-STAT BETA HCG BLOOD, ED (MC, WL, AP ONLY): I-stat hCG, quantitative: <5 m[IU]/mL (ref <5)

## 2018-01-11 NOTE — Discharge Instructions (Addendum)
You were seen in the emergency department today for trouble breathing. Your work-up in the emergency department has been overall reassuring. Your labs have been normal- you do not appear to have problems with your electrolytes or with anemia. Your EKG and the enzyme we use to check your heart did not show an acute heart attack at this time. Your chest x-ray was normal.   We are unsure of exactly what is causing her symptoms.  We recommend a trial of over-the-counter allergy medications such as Zyrtec and/or Flonase.  We would like you to follow up closely with your primary care provider within the next 1 week- if you do not have a primary care provider please utilize the phone number circled in your discharge instructions to help set up primary care or see Colleen Mills as discussed. Return to the ER immediately should you experience any new or worsening symptoms including but not limited to return of pain, worsened pain, vomiting, worsened shortness of breath, dizziness, lightheadedness, passing out, or any other concerns that you may have.

## 2018-01-11 NOTE — ED Triage Notes (Signed)
Pt reports difficulty taking a deep breath, shortness of breath and pain in her back/ shoulder that started yesterday. Denies chest pain, any recent travel, and any PMH.

## 2018-01-11 NOTE — ED Notes (Signed)
Pt returned from xr

## 2018-01-11 NOTE — ED Notes (Signed)
Patient verbalizes understanding of medications and discharge instructions. No further questions at this time. VSS and patient ambulatory at discharge.   

## 2018-01-11 NOTE — ED Provider Notes (Signed)
[facility] EMERGENCY DEPARTMENT Provider Note   CSN: [id number] Arrival date & time: 01/11/18  0115   History   Chief Complaint Chief Complaint  Patient presents with  . Shortness of Breath    HPI Colleen Mills is a 52 y.o. female without significant past medical history who presents to the emergency department with intermittent dyspnea over the past 2 days. She further describes this as feeling as if she cannot take a thorough deep breath.  She reports this can happen after eating, at rest, when laying down, and when walking around.  There does not seem to be any specific triggers or alleviating or aggravating factors.  Symptoms come and go without specific duration, symptoms are not constant, she is having sxs at present.  She also notes some left upper thoracic back pain that is worse with movement and with palpation, this is not pleuritic or exertional and does not seem to correlate with the shortness of breath.  Patient also mentions that there is been a lot of work being done in her house with window replacements, she is unsure if there is been a lot of dust in the air, however she has felt congested.  Denies fevers, productive cough, nausea, vomiting, diaphoresis, palpitations, chest pain, syncope, leg pain/swelling, hemoptysis, recent surgery/trauma, recent long travel, hormone use, personal hx of cancer, or hx of DVT/PE.   Patient does report hx of similar a few years ago that resolved spontaneously, she did have an ER visit for this, but cannot recall if a cause was identified.   HPI  History reviewed. No pertinent past medical history.  There are no active problems to display for this patient.   Past Surgical History:  Procedure Laterality Date  . HERNIA REPAIR     as a child     OB History   None      Home Medications    Prior to Admission medications   Medication Sig Start Date End Date Taking? Authorizing Provider  albuterol (PROVENTIL  HFA;VENTOLIN HFA) 108 (90 BASE) MCG/ACT inhaler Inhale 2 puffs into the lungs every 4 (four) hours as needed for wheezing or shortness of breath. 05/18/14   Cathren Laine, MD  benzonatate (TESSALON) 100 MG capsule Take 1 capsule (100 mg total) by mouth every 8 (eight) hours. 09/27/15   Audry Pili, PA-C  diazepam (VALIUM) 5 MG tablet Take 1 tablet (5 mg total) by mouth 2 (two) times daily. 05/31/17   Jacalyn Lefevre, MD  HYDROcodone-acetaminophen (NORCO/VICODIN) 5-325 MG tablet Take 1 tablet by mouth every 4 (four) hours as needed. 05/31/17   Jacalyn Lefevre, MD  methocarbamol (ROBAXIN) 500 MG tablet Take 1 tablet (500 mg total) by mouth 2 (two) times daily. 05/25/17   Kirichenko, Tatyana, PA-C  ondansetron (ZOFRAN ODT) 4 MG disintegrating tablet Take 1 tablet (4 mg total) by mouth every 8 (eight) hours as needed for nausea or vomiting. 02/18/16   Danelle Berry, PA-C  predniSONE (DELTASONE) 50 MG tablet Take 1 tablet (50 mg total) by mouth daily. 05/25/17   Jaynie Crumble, PA-C    Family History Family History  Problem Relation Age of Onset  . Hypertension Other     Social History Social History   Tobacco Use  . Smoking status: Former Smoker    Packs/day: 0.50    Types: Cigarettes  . Smokeless tobacco: Never Used  Substance Use Topics  . Alcohol use: Yes    Comment: occ  . Drug use: No  Allergies   Aleve [naproxen sodium]   Review of Systems Review of Systems  Constitutional: Negative for chills and fever.  HENT: Positive for congestion.   Respiratory: Positive for shortness of breath. Negative for cough.   Cardiovascular: Negative for chest pain, palpitations and leg swelling.  Gastrointestinal: Negative for abdominal pain, blood in stool, constipation, diarrhea, nausea and vomiting.  Musculoskeletal: Positive for back pain.  Neurological: Negative for dizziness, syncope, weakness and numbness.  All other systems reviewed and are negative.    Physical Exam Updated Vital  Signs BP (!) 132/106 (BP Location: Right Arm)   Pulse 72   Temp 98.4 F (36.9 C) (Oral)   Resp 15   Ht 5\' 9"  (1.753 m)   Wt 113.4 kg   LMP  (Within Months)   SpO2 99%   BMI 36.92 kg/m   Physical Exam  Constitutional: She appears well-developed and well-nourished.  Non-toxic appearance. No distress.  HENT:  Head: Normocephalic and atraumatic.  Eyes: Pupils are equal, round, and reactive to light. Conjunctivae and EOM are normal. Right eye exhibits no discharge. Left eye exhibits no discharge.  Neck: Neck supple. Muscular tenderness (left trapezius area) present. No spinous process tenderness present.  Cardiovascular: Normal rate and regular rhythm.  No murmur heard. Pulses:      Radial pulses are 2+ on the right side, and 2+ on the left side.       Dorsalis pedis pulses are 2+ on the right side, and 2+ on the left side.  Pulmonary/Chest: Effort normal and breath sounds normal. No respiratory distress. She has no wheezes. She has no rhonchi. She has no rales.  Respiration even and unlabored  Abdominal: Soft. She exhibits no distension. There is no tenderness.  Musculoskeletal:       Right lower leg: She exhibits no tenderness and no edema.       Left lower leg: She exhibits no tenderness and no edema.  Neurological: She is alert.  Clear speech. Symmetric grip strength. Sensation grossly intact x 4.   Skin: Skin is warm and dry. No rash noted.  Psychiatric: She has a normal mood and affect. Her behavior is normal.  Nursing note and vitals reviewed.   ED Treatments / Results  Labs Results for orders placed or performed during the hospital encounter of 01/11/18  Basic metabolic panel  Result Value Ref Range   Sodium 136 135 - 145 mmol/L   Potassium 3.5 3.5 - 5.1 mmol/L   Chloride 102 98 - 111 mmol/L   CO2 27 22 - 32 mmol/L   Glucose, Bld 95 70 - 99 mg/dL   BUN 11 6 - 20 mg/dL   Creatinine, Ser 1.61 0.44 - 1.00 mg/dL   Calcium 09.6 8.9 - 04.5 mg/dL   GFR calc non Af Amer  >60 >60 mL/min   GFR calc Af Amer >60 >60 mL/min   Anion gap 7 5 - 15  CBC  Result Value Ref Range   WBC 7.5 4.0 - 10.5 K/uL   RBC 4.17 3.87 - 5.11 MIL/uL   Hemoglobin 12.5 12.0 - 15.0 g/dL   HCT 40.9 81.1 - 91.4 %   MCV 90.2 78.0 - 100.0 fL   MCH 30.0 26.0 - 34.0 pg   MCHC 33.2 30.0 - 36.0 g/dL   RDW 78.2 95.6 - 21.3 %   Platelets 220 150 - 400 K/uL  I-stat troponin, ED  Result Value Ref Range   Troponin i, poc 0.01 0.00 - 0.08 ng/mL  Comment 3          I-Stat beta hCG blood, ED  Result Value Ref Range   I-stat hCG, quantitative <5.0 <5 mIU/mL   Comment 3           EKG EKG Interpretation  Date/Time:  Friday January 11 2018 01:24:05 EDT Ventricular Rate:  68 PR Interval:    QRS Duration: 94 QT Interval:  379 QTC Calculation: 403 R Axis:   49 Text Interpretation:  Sinus rhythm Low voltage, precordial leads No significant change since last tracing Confirmed by Drema Pryardama, Pedro [phone]([id number]) on 01/11/2018 1:29:38 AM   Radiology Dg Chest 2 View  Result Date: 01/11/2018 CLINICAL DATA:  Short of breath EXAM: CHEST - 2 VIEW COMPARISON:  02/17/2016 FINDINGS: The heart size and mediastinal contours are within normal limits. Both lungs are clear. The visualized skeletal structures are unremarkable. IMPRESSION: No active cardiopulmonary disease. Electronically Signed   By: Jasmine PangKim  Fujinaga M.D.   On: 01/11/2018 02:32    Procedures Procedures (including critical care time)  Medications Ordered in ED Medications - No data to display   Initial Impression / Assessment and Plan / ED Course  I have reviewed the triage vital signs and the nursing notes.  Pertinent labs & imaging results that were available during my care of the patient were reviewed by me and considered in my medical decision making (see chart for details).    Patient presents to the emergency department with complaints of intermittent dyspnea. Patient nontoxic appearing, in no apparent distress, vitals without significant  abnormality, elevated diastolic BP, doubt HTN emergency. Fairly benign physical exam. Patient reports trouble taking a deep breath consistent with her sxs while I am in the room- cardiac monitor with NSR. RR normal. SpO2 >95% with good wave form. Her upper back pain is reproducible with palpation of trapezius area and appears muscular in nature. DDX: Infectious, allergic, pneumothorax, effusion, arrhythmia, anemia, electrolyte derangement, MSK, body habitus, ACS, pulmonary embolism,. Evaluation initiated with labs, EKG, and CXR. Patient on cardiac monitor.   Work-up in the ER unremarkable. Labs reviewed, no leukocytosis, anemia, or significant electrolyte abnormality. CXR without infiltrate, effusion, pneumothorax, or fracture/dislocation. Patient without chest pain or exertional specific sxs, low risk heart score, EKG without obvious ischemia, troponin negative, doubt ACS. Patient is low risk wells, doubt pulmonary embolism. Cardiac monitor reviewed, no notable arrhythmias or tachycardia. Patient has appeared hemodynamically stable throughout ER visit and appears safe for discharge with close PCP follow up. I discussed results, treatment plan, need for PCP follow-up, and return precautions with the patient. Provided opportunity for questions, patient confirmed understanding and is in agreement with plan.   Findings and plan of care discussed with supervising physician Dr. Eudelia Bunchardama who personally evaluated and examined this patient and is in agreement.   Vitals:   01/11/18 0122 01/11/18 0330  BP: (!) 132/106 118/75  Pulse: 72 73  Resp: 15 20  Temp: 98.4 F (36.9 C)   SpO2: 99% 100%      Final Clinical Impressions(s) / ED Diagnoses   Final diagnoses:  Shortness of breath    ED Discharge Orders    None       Cherly Andersonetrucelli, Kermitt Harjo R, PA-C 01/11/18 0631    Nira Connardama, Pedro Eduardo, MD 01/11/18 [phone]

## 2018-01-14 ENCOUNTER — Ambulatory Visit: Payer: Medicaid Other | Admitting: Podiatry

## 2018-01-28 NOTE — Progress Notes (Signed)
Subjective:   Patient ID: Colleen Mills, female   DOB: 52 y.o.   MRN: [id number]   HPI Patient presents stating that she is developed pain again in the bottom of her heel in the back   ROS      Objective:  Physical Exam  Neurovascular status found to be intact with acute inflammation plantar aspect heel region bilateral with fluid buildup around the medial knee     Assessment:  Acute plantar fasciitis bilateral heel     Plan:  Reviewed condition physical therapy and today injected the plantar fascial bilateral 3 mg Kenalog 5 mg Xylocaine physical therapy.  Reappoint to recheck

## 2018-04-29 ENCOUNTER — Ambulatory Visit (HOSPITAL_COMMUNITY)
Admission: EM | Admit: 2018-04-29 | Discharge: 2018-04-29 | Disposition: A | Discharge status: Home / Self Care | Payer: Medicaid Other | Attending: Family Medicine | Admitting: Family Medicine

## 2018-04-29 ENCOUNTER — Encounter (HOSPITAL_COMMUNITY): Payer: Self-pay | Admitting: Emergency Medicine

## 2018-04-29 ENCOUNTER — Other Ambulatory Visit: Payer: Self-pay

## 2018-04-29 DIAGNOSIS — J019 Acute sinusitis, unspecified: Secondary | ICD-10-CM | POA: Diagnosis not present

## 2018-04-29 DIAGNOSIS — J029 Acute pharyngitis, unspecified: Secondary | ICD-10-CM | POA: Diagnosis not present

## 2018-04-29 MED ORDER — HYDROCODONE-HOMATROPINE 5-1.5 MG/5ML PO SYRP: 5.0000 mL | ORAL_SOLUTION | Freq: Every day | ORAL | 0 refills | Status: DC

## 2018-04-29 MED ORDER — AMOXICILLIN-POT CLAVULANATE 875-125 MG PO TABS: 1.0000 | ORAL_TABLET | Freq: Two times a day (BID) | ORAL | 0 refills | Status: DC

## 2018-04-29 MED ORDER — PSEUDOEPH-BROMPHEN-DM 30-2-10 MG/5ML PO SYRP: 5.0000 mL | ORAL_SOLUTION | Freq: Four times a day (QID) | ORAL | 0 refills | Status: DC | PRN

## 2018-04-29 NOTE — ED Provider Notes (Signed)
[facility]    CSN: [id number] Arrival date & time: 04/29/18  [id number]     History   Chief Complaint Chief Complaint  Patient presents with  . URI    HPI Colleen Mills is a 52 y.o. female no significant past medical history, Patient is presenting with URI symptoms- congestion, cough, sore throat.  patient's main complaints are cough disrupting her sleep. Symptoms have been going on for over 1 week. Patient has tried Mucinex and Zyrtec, with minimal relief. Denies fever, nausea, vomiting, diarrhea. Denies shortness of breath and chest pain.    HPI  History reviewed. No pertinent past medical history.  There are no active problems to display for this patient.   Past Surgical History:  Procedure Laterality Date  . HERNIA REPAIR     as a child    OB History   None      Home Medications    Prior to Admission medications   Medication Sig Start Date End Date Taking? Authorizing Provider  amoxicillin-clavulanate (AUGMENTIN) 875-125 MG tablet Take 1 tablet by mouth every 12 (twelve) hours. 04/29/18   Fabyan Loughmiller C, PA-C  brompheniramine-pseudoephedrine-DM 30-2-10 MG/5ML syrup Take 5 mLs by mouth 4 (four) times daily as needed. For daytime cough 04/29/18   Istvan Behar C, PA-C  HYDROcodone-homatropine (HYCODAN) 5-1.5 MG/5ML syrup Take 5 mLs by mouth at bedtime. 04/29/18   Muhsin Doris, Junius CreamerHallie C, PA-C    Family History Family History  Problem Relation Age of Onset  . Hypertension Other     Social History Social History   Tobacco Use  . Smoking status: Former Smoker    Packs/day: 0.50    Types: Cigarettes  . Smokeless tobacco: Never Used  Substance Use Topics  . Alcohol use: Yes    Comment: occ  . Drug use: No     Allergies   Aleve [naproxen sodium]   Review of Systems Review of Systems  Constitutional: Negative for activity change, appetite change, chills, fatigue and fever.  HENT: Positive for congestion, rhinorrhea, sinus pressure and sore  throat. Negative for ear pain and trouble swallowing.   Eyes: Negative for discharge and redness.  Respiratory: Positive for cough. Negative for chest tightness, shortness of breath and wheezing.   Cardiovascular: Negative for chest pain.  Gastrointestinal: Negative for abdominal pain, diarrhea, nausea and vomiting.  Musculoskeletal: Negative for myalgias.  Skin: Negative for rash.  Neurological: Negative for dizziness, light-headedness and headaches.     Physical Exam Triage Vital Signs ED Triage Vitals  Enc Vitals Group     BP 04/29/18 0834 116/79     Pulse Rate 04/29/18 0834 71     Resp 04/29/18 0834 18     Temp 04/29/18 0834 98.4 F (36.9 C)     Temp Source 04/29/18 0834 Oral     SpO2 04/29/18 0834 97 %     Weight --      Height --      Head Circumference --      Peak Flow --      Pain Score 04/29/18 0831 5     Pain Loc --      Pain Edu? --      Excl. in GC? --    No data found.  Updated Vital Signs BP 116/79 (BP Location: Right Arm)   Pulse 71   Temp 98.4 F (36.9 C) (Oral)   Resp 18   SpO2 97%   Visual Acuity Right Eye Distance:   Left Eye Distance:  Bilateral Distance:    Right Eye Near:   Left Eye Near:    Bilateral Near:     Physical Exam  Constitutional: She appears well-developed and well-nourished. No distress.  HENT:  Head: Normocephalic and atraumatic.  Bilateral ears without tenderness to palpation of external auricle, tragus and mastoid, EAC's without erythema or swelling, TM's with good bony landmarks and cone of light. Non erythematous.  Left TM appears to have congestion/effusion, appears slightly inflamed, but not infected  Nasal mucosa erythematous, swollen turbinates bilaterally  Oral mucosa pink and moist, no tonsillar enlargement or exudate. Posterior pharynx patent and erythematous, no uvula deviation or swelling. Normal phonation.  Eyes: Conjunctivae are normal.  Neck: Neck supple.  Cardiovascular: Normal rate and regular rhythm.   No murmur heard. Pulmonary/Chest: Effort normal and breath sounds normal. No respiratory distress.  Breathing comfortably at rest, CTABL, no wheezing, rales or other adventitious sounds auscultated  Abdominal: Soft. There is no tenderness.  Musculoskeletal: She exhibits no edema.  Neurological: She is alert.  Skin: Skin is warm and dry.  Psychiatric: She has a normal mood and affect.  Nursing note and vitals reviewed.    UC Treatments / Results  Labs (all labs ordered are listed, but only abnormal results are displayed) Labs Reviewed - No data to display  EKG None  Radiology No results found.  Procedures Procedures (including critical care time)  Medications Ordered in UC Medications - No data to display  Initial Impression / Assessment and Plan / UC Course  I have reviewed the triage vital signs and the nursing notes.  Pertinent labs & imaging results that were available during my care of the patient were reviewed by me and considered in my medical decision making (see chart for details).     Vital signs stable, URI symptoms for over 1 week.  Will treat for acute sinusitis, will provide Augmentin to take for the next 10 days.  Recommended continuing Zyrtec and Mucinex.  Provided Hycodan to use at nighttime, combo brompheniramine cough syrup during the day.  Discussed further symptomatic recommendations.  Lungs clear at this time.  Follow-up if symptoms not resolving, worsening or developing new symptoms.Discussed strict return precautions. Patient verbalized understanding and is agreeable with plan.  Final Clinical Impressions(s) / UC Diagnoses   Final diagnoses:  Acute sinusitis with symptoms > 10 days     Discharge Instructions     Please begin taking Augmentin twice daily for the next 10 days to treat sinus infection I would recommend continuing daily Zyrtec, Mucinex as needed to help with congestion You may use combination cough syrup-  brompheneramine/pseudoephedrine-DM-during the day, you may use Hycodan cough syrup at bedtime.  Hycodan will cause drowsiness, do not drive or work after taking.  May use honey/lemon mixed in hot tea to help with sore throat and hoarseness  Tylenol and ibuprofen as needed for any body aches/headaches  Drink plenty of fluids  Please follow-up if symptoms not resolving with the above over the next 4 to 5 days.  Follow-up if symptoms worsening.   ED Prescriptions    Medication Sig Dispense Auth. Provider   amoxicillin-clavulanate (AUGMENTIN) 875-125 MG tablet Take 1 tablet by mouth every 12 (twelve) hours. 14 tablet Alexismarie Flaim C, PA-C   brompheniramine-pseudoephedrine-DM 30-2-10 MG/5ML syrup Take 5 mLs by mouth 4 (four) times daily as needed. For daytime cough 120 mL Angelette Ganus C, PA-C   HYDROcodone-homatropine (HYCODAN) 5-1.5 MG/5ML syrup Take 5 mLs by mouth at bedtime. 60 mL Rekha Hobbins,  Colum Colt C, PA-C     Controlled Substance Prescriptions [place] Controlled Substance Registry consulted? No   Lew Dawes, [place] 04/29/18 [phone]

## 2018-04-29 NOTE — Discharge Instructions (Signed)
Please begin taking Augmentin twice daily for the next 10 days to treat sinus infection I would recommend continuing daily Zyrtec, Mucinex as needed to help with congestion You may use combination cough syrup- brompheneramine/pseudoephedrine-DM-during the day, you may use Hycodan cough syrup at bedtime.  Hycodan will cause drowsiness, do not drive or work after taking.  May use honey/lemon mixed in hot tea to help with sore throat and hoarseness  Tylenol and ibuprofen as needed for any body aches/headaches  Drink plenty of fluids  Please follow-up if symptoms not resolving with the above over the next 4 to 5 days.  Follow-up if symptoms worsening.

## 2018-04-29 NOTE — ED Triage Notes (Signed)
Cough, congestion and runny nose for a week.  Cough is disrupting sleep

## 2018-05-16 ENCOUNTER — Encounter (HOSPITAL_COMMUNITY): Payer: Self-pay

## 2018-05-16 ENCOUNTER — Ambulatory Visit (HOSPITAL_COMMUNITY)
Admission: EM | Admit: 2018-05-16 | Discharge: 2018-05-16 | Disposition: A | Discharge status: Home / Self Care | Payer: Medicaid Other | Attending: Internal Medicine | Admitting: Internal Medicine

## 2018-05-16 DIAGNOSIS — M722 Plantar fascial fibromatosis: Secondary | ICD-10-CM

## 2018-05-16 MED ORDER — OXYCODONE-ACETAMINOPHEN 5-325 MG PO TABS: 2.0000 | ORAL_TABLET | ORAL | 0 refills | Status: DC | PRN

## 2018-05-16 MED ORDER — ONDANSETRON HCL 4 MG PO TABS: 4.0000 mg | ORAL_TABLET | Freq: Four times a day (QID) | ORAL | 0 refills | Status: DC

## 2018-05-16 MED ORDER — MELOXICAM 7.5 MG PO TABS: 7.5000 mg | ORAL_TABLET | Freq: Two times a day (BID) | ORAL | 0 refills | Status: AC | PRN

## 2018-05-16 NOTE — ED Provider Notes (Signed)
[facility]    CSN: [id number] Arrival date & time: 05/16/18  1736     History   Chief Complaint Chief Complaint  Patient presents with  . Appointment  . (5:45 Foot Pain)    HPI Colleen Mills is a 52 y.o. female.   18 female complains of bilateral foot pain. She denies trauma or strenuous activity. Pain >1 week. This has happened before and she was dx with plantar fascitis.  She is having difficulty walking.      History reviewed. No pertinent past medical history.  There are no active problems to display for this patient.   Past Surgical History:  Procedure Laterality Date  . HERNIA REPAIR     as a child    OB History   No obstetric history on file.      Home Medications    Prior to Admission medications   Medication Sig Start Date End Date Taking? Authorizing Provider  amoxicillin-clavulanate (AUGMENTIN) 875-125 MG tablet Take 1 tablet by mouth every 12 (twelve) hours. 04/29/18   Wieters, Hallie C, PA-C  brompheniramine-pseudoephedrine-DM 30-2-10 MG/5ML syrup Take 5 mLs by mouth 4 (four) times daily as needed. For daytime cough 04/29/18   Wieters, Hallie C, PA-C  HYDROcodone-homatropine (HYCODAN) 5-1.5 MG/5ML syrup Take 5 mLs by mouth at bedtime. 04/29/18   Wieters, Hallie C, PA-C  meloxicam (MOBIC) 7.5 MG tablet Take 1 tablet (7.5 mg total) by mouth 2 (two) times daily as needed for up to 14 days for pain. Do NOT take on an empty stomach. Drink plenty of water 05/16/18 05/30/18  Arnaldo Natal, MD  ondansetron (ZOFRAN) 4 MG tablet Take 1 tablet (4 mg total) by mouth every 6 (six) hours. As needed for nausea 05/16/18   Arnaldo Natal, MD  oxyCODONE-acetaminophen (PERCOCET/ROXICET) 5-325 MG tablet Take 2 tablets by mouth every 4 (four) hours as needed for severe pain. 05/16/18   Arnaldo Natal, MD    Family History Family History  Problem Relation Age of Onset  . Hypertension Other     Social History Social History   Tobacco Use  .  Smoking status: Former Smoker    Packs/day: 0.50    Types: Cigarettes  . Smokeless tobacco: Never Used  Substance Use Topics  . Alcohol use: Yes    Comment: occ  . Drug use: No     Allergies   Aleve [naproxen sodium]   Review of Systems Review of Systems  Constitutional: Negative for chills and fever.  HENT: Negative for sore throat and tinnitus.   Eyes: Negative for redness.  Respiratory: Negative for cough and shortness of breath.   Cardiovascular: Negative for chest pain and palpitations.  Gastrointestinal: Negative for abdominal pain, diarrhea, nausea and vomiting.  Genitourinary: Negative for dysuria, frequency and urgency.  Musculoskeletal: Positive for gait problem and myalgias.  Skin: Negative for rash.       No lesions  Neurological: Negative for weakness.  Hematological: Does not bruise/bleed easily.  Psychiatric/Behavioral: Negative for suicidal ideas.     Physical Exam Triage Vital Signs ED Triage Vitals  Enc Vitals Group     BP 05/16/18 1817 124/90     Pulse Rate 05/16/18 1817 73     Resp 05/16/18 1817 20     Temp 05/16/18 1817 98.1 F (36.7 C)     Temp Source 05/16/18 1817 Oral     SpO2 05/16/18 1817 100 %     Weight --      Height --  Head Circumference --      Peak Flow --      Pain Score 05/16/18 1819 8     Pain Loc --      Pain Edu? --      Excl. in GC? --    No data found.  Updated Vital Signs BP 124/90 (BP Location: Right Arm)   Pulse 73   Temp 98.1 F (36.7 C) (Oral)   Resp 20   SpO2 100%   Visual Acuity Right Eye Distance:   Left Eye Distance:   Bilateral Distance:    Right Eye Near:   Left Eye Near:    Bilateral Near:     Physical Exam Vitals signs and nursing note reviewed.  Constitutional:      General: She is not in acute distress.    Appearance: She is well-developed.  HENT:     Head: Normocephalic and atraumatic.  Eyes:     General: No scleral icterus.    Conjunctiva/sclera: Conjunctivae normal.      Pupils: Pupils are equal, round, and reactive to light.  Neck:     Musculoskeletal: Normal range of motion and neck supple.     Thyroid: No thyromegaly.     Vascular: No JVD.     Trachea: No tracheal deviation.  Cardiovascular:     Rate and Rhythm: Normal rate and regular rhythm.     Heart sounds: Normal heart sounds. No murmur. No friction rub. No gallop.   Pulmonary:     Effort: Pulmonary effort is normal.     Breath sounds: Normal breath sounds.  Abdominal:     General: Bowel sounds are normal. There is no distension.     Palpations: Abdomen is soft.     Tenderness: There is no abdominal tenderness.  Musculoskeletal: Normal range of motion.  Lymphadenopathy:     Cervical: No cervical adenopathy.  Skin:    General: Skin is warm and dry.  Neurological:     Mental Status: She is alert and oriented to person, place, and time.     Cranial Nerves: No cranial nerve deficit.  Psychiatric:        Behavior: Behavior normal.        Thought Content: Thought content normal.        Judgment: Judgment normal.      UC Treatments / Results  Labs (all labs ordered are listed, but only abnormal results are displayed) Labs Reviewed - No data to display  EKG None  Radiology No results found.  Procedures Procedures (including critical care time)  Medications Ordered in UC Medications - No data to display  Initial Impression / Assessment and Plan / UC Course  I have reviewed the triage vital signs and the nursing notes.  Pertinent labs & imaging results that were available during my care of the patient were reviewed by me and considered in my medical decision making (see chart for details).     Pain typical of plantar fascitis. She already has good orthotics and an excellent anatomical arch of her foot. Rx NSAID's and short course of narcs. Emphasized on-going physical therapy.    Final Clinical Impressions(s) / UC Diagnoses   Final diagnoses:  Plantar fasciitis, bilateral     Discharge Instructions   None    ED Prescriptions    Medication Sig Dispense Auth. Provider   meloxicam (MOBIC) 7.5 MG tablet Take 1 tablet (7.5 mg total) by mouth 2 (two) times daily as needed for up to 14 days  for pain. Do NOT take on an empty stomach. Drink plenty of water 28 tablet Arnaldo Nataliamond, Twanna Resh S, MD   oxyCODONE-acetaminophen (PERCOCET/ROXICET) 5-325 MG tablet Take 2 tablets by mouth every 4 (four) hours as needed for severe pain. 15 tablet Arnaldo Nataliamond, Ronnett Pullin S, MD   ondansetron (ZOFRAN) 4 MG tablet Take 1 tablet (4 mg total) by mouth every 6 (six) hours. As needed for nausea 12 tablet Arnaldo Nataliamond, Joeziah Voit S, MD     Controlled Substance Prescriptions [place] Controlled Substance Registry consulted? No   Arnaldo Nataliamond, Tenesia Escudero S, MD 05/16/18 [phone]

## 2018-05-16 NOTE — ED Notes (Signed)
Patient able to ambulate independently  

## 2018-05-16 NOTE — ED Triage Notes (Signed)
Pt presents with pain in both feet not related to any injury.

## 2019-02-28 ENCOUNTER — Ambulatory Visit (INDEPENDENT_AMBULATORY_CARE_PROVIDER_SITE_OTHER): Payer: Medicaid Other

## 2019-02-28 ENCOUNTER — Ambulatory Visit: Payer: Medicaid Other | Admitting: Podiatry

## 2019-02-28 ENCOUNTER — Other Ambulatory Visit: Payer: Self-pay | Admitting: Podiatry

## 2019-02-28 ENCOUNTER — Encounter: Payer: Self-pay | Admitting: Podiatry

## 2019-02-28 ENCOUNTER — Ambulatory Visit: Payer: Medicaid Other

## 2019-02-28 ENCOUNTER — Other Ambulatory Visit: Payer: Self-pay

## 2019-02-28 DIAGNOSIS — M79671 Pain in right foot: Secondary | ICD-10-CM

## 2019-02-28 DIAGNOSIS — M722 Plantar fascial fibromatosis: Secondary | ICD-10-CM

## 2019-02-28 DIAGNOSIS — M7661 Achilles tendinitis, right leg: Secondary | ICD-10-CM

## 2019-02-28 DIAGNOSIS — M2012 Hallux valgus (acquired), left foot: Secondary | ICD-10-CM

## 2019-02-28 DIAGNOSIS — M79672 Pain in left foot: Secondary | ICD-10-CM

## 2019-02-28 NOTE — Progress Notes (Signed)
Subjective:  Patient ID: Colleen Mills, female    DOB: 1965-07-30,  MRN: [id number]  Chief Complaint  Patient presents with  . Foot Pain    Right foot; back of heel; Left foot; arch; pt stated, "My feet hurt all the time; having a flare-up"; x1 yr  . Surgery Consultation    Bilateral; Bunions; pt wants to discuss surgery    53 y.o. female presents with the above complaint.  Patient states that she has a large plantar fasciitis pain on the heel plantar medial side.  She also has a complaint of left foot bunion deformity.  She has tried other conservative therapy including injection taping padding deformity but has failed.  She has also had injection previously about half ago to the left heel which lasted her this month but is now starting to act back up again.  She wishes to discuss surgical options for the left bunion given that it is more severe than the right side.  And she also wishes to do the left plantar fascia injection at the same time.   Review of Systems: Negative except as noted in the HPI. Denies N/V/F/Ch.  History reviewed. No pertinent past medical history. No current outpatient medications on file.  Social History   Tobacco Use  Smoking Status Former Smoker  . Packs/day: 0.50  . Types: Cigarettes  Smokeless Tobacco Never Used    Allergies  Allergen Reactions  . Aleve [Naproxen Sodium] Nausea And Vomiting    Patient states medication makes her sick, "Nausea and throwing up"   Objective:  There were no vitals filed for this visit. There is no height or weight on file to calculate BMI. Constitutional Well developed. Well nourished.  Vascular Dorsalis pedis pulses palpable bilaterally. Posterior tibial pulses palpable bilaterally. Capillary refill normal to all digits.  No cyanosis or clubbing noted. Pedal hair growth normal.  Neurologic Normal speech. Oriented to person, place, and time. Epicritic sensation to light touch grossly present bilaterally.   Dermatologic Nails well groomed and normal in appearance. No open wounds. No skin lesions.  Orthopedic: Normal joint ROM without pain or crepitus bilaterally. No visible deformities. Tender to palpation at the calcaneal tuber left. No pain with calcaneal squeeze left. Ankle ROM full range of motion left. Silfverskiold Test: positive right. Pain on palpation to the left bunion deformity.  No joint crepitus of the first MPJ noted.  No pain on palpation on end range of motion of the first MPJ.  Bunion deformity is track bound not tracking.  Dorsal medial eminence noted in the left first MPJ. Normal joint ROM without pain or crepitus bilaterally. Hallux abductovalgus deformity present Left 1st MPJ diminished range of motion. Left 1st TMT without gross hypermobility. Right 1st MPJ diminished range of motion  Right 1st TMT without gross hypermobility. Lesser digital contractures absent bilaterally.   Radiographs: Taken and reviewed. No acute fractures or dislocations. No evidence of stress fracture.  Plantar heel spur present. Posterior heel spur present.  There is an increase in IM angle moderate in nature.  There is increasing soft tissue density on the first medial aspect of the left MPJ.  There is an associated subchondral sclerosis of the base of the proximal phalanx.  There is an increase in hallux abductor's angle.  Sesamoid position appears to be 5.  Assessment:   1. Plantar fasciitis   2. Achilles tendinitis of right lower extremity    Plan:  Patient was evaluated and treated and all questions answered.  Left  foot hallux abducto valgus deformity -I had a frank discussion with the patient on the etiology and treat options of the left bunion deformity she states she understood the risks and concepts of undergoing the procedure.  She wishes to have the surgical correction of the left foot bunion deformity first.  I explained to her that she will have 2 screw fixation in her foot.  I  plan on performing a distal osteotomy with screw fixation.I informed surgical risk consent was reviewed and read aloud to the patient.  I reviewed the films.  I have discussed my findings with the patient in great detail.  I have discussed all risks including but not limited to infection, stiffness, scarring, limp, disability, deformity, damage to blood vessels and nerves, numbness, poor healing, need for braces, arthritis, chronic pain, amputation, death.  All benefits and realistic expectations discussed in great detail.  I have made no promises as to the outcome.  I have provided realistic expectations.  I have offered the patient a 2nd opinion, which they have declined and assured me they preferred to proceed despite the risks -She will have a cam boot postoperatively.  She will be weightbearing as tolerated in a cam boot.  Instructed to not get her foot wet.   Plantar Fasciitis, Left - XR reviewed as above.  - Educated on icing and stretching. Instructions given.  -I will perform a plantar fasciitis injection of the left heel at the same time as a bunion deformity correction.

## 2019-02-28 NOTE — Patient Instructions (Addendum)
Pre-Operative Instructions  Congratulations, you have decided to take an important step towards improving your quality of life.  You can be assured that the doctors and staff at [facility] will be with you every step of the way.  Here are some important things you should know:  1. Plan to be at the surgery center/hospital at least 1 (one) hour prior to your scheduled time, unless otherwise directed by the surgical center/hospital staff.  You must have a responsible adult accompany you, remain during the surgery and drive you home.  Make sure you have directions to the surgical center/hospital to ensure you arrive on time. 2. If you are having surgery at [facility] or [facility], you will need a copy of your medical history and physical form from your family physician within one month prior to the date of surgery. We will give you a form for your primary physician to complete.  3. We make every effort to accommodate the date you request for surgery.  However, there are times where surgery dates or times have to be moved.  We will contact you as soon as possible if a change in schedule is required.   4. No aspirin/ibuprofen for one week before surgery.  If you are on aspirin, any non-steroidal anti-inflammatory medications (Mobic, Aleve, Ibuprofen) should not be taken seven (7) days prior to your surgery.  You make take Tylenol for pain prior to surgery.  5. Medications - If you are taking daily heart and blood pressure medications, seizure, reflux, allergy, asthma, anxiety, pain or diabetes medications, make sure you notify the surgery center/hospital before the day of surgery so they can tell you which medications you should take or avoid the day of surgery. 6. No food or drink after midnight the night before surgery unless directed otherwise by surgical center/hospital staff. 7. No alcoholic beverages 24-hours prior to surgery.  No smoking 24-hours prior or 24-hours after  surgery. 8. Wear loose pants or shorts. They should be loose enough to fit over bandages, boots, and casts. 9. Don't wear slip-on shoes. Sneakers are preferred. 10. Bring your boot with you to the surgery center/hospital.  Also bring crutches or a walker if your physician has prescribed it for you.  If you do not have this equipment, it will be provided for you after surgery. 11. If you have not been contacted by the surgery center/hospital by the day before your surgery, call to confirm the date and time of your surgery. 12. Leave-time from work may vary depending on the type of surgery you have.  Appropriate arrangements should be made prior to surgery with your employer. 13. Prescriptions will be provided immediately following surgery by your doctor.  Fill these as soon as possible after surgery and take the medication as directed. Pain medications will not be refilled on weekends and must be approved by the doctor. 14. Remove nail polish on the operative foot and avoid getting pedicures prior to surgery. 15. Wash the night before surgery.  The night before surgery wash the foot and leg well with water and the antibacterial soap provided. Be sure to pay special attention to beneath the toenails and in between the toes.  Wash for at least three (3) minutes. Rinse thoroughly with water and dry well with a towel.  Perform this wash unless told not to do so by your physician.  Enclosed: 1 Ice pack (please put in freezer the night before surgery)   1 Hibiclens skin cleaner  Pre-op instructions  If you have any questions regarding the instructions, please do not hesitate to call our office.  [place]: [address] [place], [place] [address] -- [phone]  [place]: [address] [place], [place] [address] -- [phone]  : [address], [place] [address] -- [phone]   Website: [email or web address] The plantar fascia is a fibrous, ligament-like,  soft-tissue structure that spans the bottom of the foot. Plantar fasciitis is a condition that causes pain in the foot due to inflammation of the tissue. SYMPTOMS   Pain and tenderness on the underneath side of the foot.  Pain that worsens with standing or walking. CAUSES  Plantar fasciitis is caused by irritation and injury to the plantar fascia on the underneath side of the foot. Common mechanisms of injury include:  Direct trauma to bottom of the foot.  Damage to a small nerve that runs under the foot where the main fascia attaches to the heel bone.  Stress placed on the plantar fascia due to bone spurs. RISK INCREASES WITH:   Activities that place stress on the plantar fascia (running, jumping, pivoting, or cutting).  Poor strength and flexibility.  Improperly fitted shoes.  Tight calf muscles.  Flat feet.  Failure to warm-up properly before activity.  Obesity. PREVENTION  Warm up and stretch properly before activity.  Allow for adequate recovery between workouts.  Maintain physical fitness:  Strength, flexibility, and endurance.  Cardiovascular fitness.  Maintain a health body weight.  Avoid stress on the plantar fascia.  Wear properly fitted shoes, including arch supports for individuals who have flat feet.  PROGNOSIS  If treated properly, then the symptoms of plantar fasciitis usually resolve without surgery. However, occasionally surgery is necessary.  RELATED COMPLICATIONS   Recurrent symptoms that may result in a chronic condition.  Problems of the lower back that are caused by compensating for the injury, such as limping.  Pain or weakness of the foot during push-off following surgery.  Chronic inflammation, scarring, and partial or complete fascia tear, occurring more often from repeated injections.  TREATMENT  Treatment initially involves the use of ice and medication to help reduce pain and inflammation. The use of strengthening and  stretching exercises may help reduce pain with activity, especially stretches of the Achilles tendon. These exercises may be performed at home or with a therapist. Your caregiver may recommend that you use heel cups of arch supports to help reduce stress on the plantar fascia. Occasionally, corticosteroid injections are given to reduce inflammation. If symptoms persist for greater than 6 months despite non-surgical (conservative), then surgery may be recommended.   MEDICATION   If pain medication is necessary, then nonsteroidal anti-inflammatory medications, such as aspirin and ibuprofen, or other minor pain relievers, such as acetaminophen, are often recommended.  Do not take pain medication within 7 days before surgery.  Prescription pain relievers may be given if deemed necessary by your caregiver. Use only as directed and only as much as you need.  Corticosteroid injections may be given by your caregiver. These injections should be reserved for the most serious cases, because they may only be given a certain number of times.  HEAT AND COLD  Cold treatment (icing) relieves pain and reduces inflammation. Cold treatment should be applied for 10 to 15 minutes every 2 to 3 hours for inflammation and pain and immediately after any activity that aggravates your symptoms. Use ice packs or massage the area with a piece of ice (ice massage).  Heat treatment  may be used prior to performing the stretching and strengthening activities prescribed by your caregiver, physical therapist, or athletic trainer. Use a heat pack or soak the injury in warm water.  SEEK IMMEDIATE MEDICAL CARE IF:  Treatment seems to offer no benefit, or the condition worsens.  Any medications produce adverse side effects.  EXERCISES- RANGE OF MOTION (ROM) AND STRETCHING EXERCISES - Plantar Fasciitis (Heel Spur Syndrome) These exercises may help you when beginning to rehabilitate your injury. Your symptoms may resolve with or  without further involvement from your physician, physical therapist or athletic trainer. While completing these exercises, remember:   Restoring tissue flexibility helps normal motion to return to the joints. This allows healthier, less painful movement and activity.  An effective stretch should be held for at least 30 seconds.  A stretch should never be painful. You should only feel a gentle lengthening or release in the stretched tissue.  RANGE OF MOTION - Toe Extension, Flexion  Sit with your right / left leg crossed over your opposite knee.  Grasp your toes and gently pull them back toward the top of your foot. You should feel a stretch on the bottom of your toes and/or foot.  Hold this stretch for 10 seconds.  Now, gently pull your toes toward the bottom of your foot. You should feel a stretch on the top of your toes and or foot.  Hold this stretch for 10 seconds. Repeat  times. Complete this stretch 3 times per day.   RANGE OF MOTION - Ankle Dorsiflexion, Active Assisted  Remove shoes and sit on a chair that is preferably not on a carpeted surface.  Place right / left foot under knee. Extend your opposite leg for support.  Keeping your heel down, slide your right / left foot back toward the chair until you feel a stretch at your ankle or calf. If you do not feel a stretch, slide your bottom forward to the edge of the chair, while still keeping your heel down.  Hold this stretch for 10 seconds. Repeat 3 times. Complete this stretch 2 times per day.   STRETCH  Gastroc, Standing  Place hands on wall.  Extend right / left leg, keeping the front knee somewhat bent.  Slightly point your toes inward on your back foot.  Keeping your right / left heel on the floor and your knee straight, shift your weight toward the wall, not allowing your back to arch.  You should feel a gentle stretch in the right / left calf. Hold this position for 10 seconds. Repeat 3 times. Complete this  stretch 2 times per day.  STRETCH  Soleus, Standing  Place hands on wall.  Extend right / left leg, keeping the other knee somewhat bent.  Slightly point your toes inward on your back foot.  Keep your right / left heel on the floor, bend your back knee, and slightly shift your weight over the back leg so that you feel a gentle stretch deep in your back calf.  Hold this position for 10 seconds. Repeat 3 times. Complete this stretch 2 times per day.  STRETCH  Gastrocsoleus, Standing  Note: This exercise can place a lot of stress on your foot and ankle. Please complete this exercise only if specifically instructed by your caregiver.   Place the ball of your right / left foot on a step, keeping your other foot firmly on the same step.  Hold on to the wall or a rail for balance.  Slowly lift your other foot, allowing your body weight to press your heel down over the edge of the step.  You should feel a stretch in your right / left calf.  Hold this position for 10 seconds.  Repeat this exercise with a slight bend in your right / left knee. Repeat 3 times. Complete this stretch 2 times per day.   STRENGTHENING EXERCISES - Plantar Fasciitis (Heel Spur Syndrome)  These exercises may help you when beginning to rehabilitate your injury. They may resolve your symptoms with or without further involvement from your physician, physical therapist or athletic trainer. While completing these exercises, remember:   Muscles can gain both the endurance and the strength needed for everyday activities through controlled exercises.  Complete these exercises as instructed by your physician, physical therapist or athletic trainer. Progress the resistance and repetitions only as guided.  STRENGTH - Towel Curls  Sit in a chair positioned on a non-carpeted surface.  Place your foot on a towel, keeping your heel on the floor.  Pull the towel toward your heel by only curling your toes. Keep your heel on  the floor. Repeat 3 times. Complete this exercise 2 times per day.  STRENGTH - Ankle Inversion  Secure one end of a rubber exercise band/tubing to a fixed object (table, pole). Loop the other end around your foot just before your toes.  Place your fists between your knees. This will focus your strengthening at your ankle.  Slowly, pull your big toe up and in, making sure the band/tubing is positioned to resist the entire motion.  Hold this position for 10 seconds.  Have your muscles resist the band/tubing as it slowly pulls your foot back to the starting position. Repeat 3 times. Complete this exercises 2 times per day.  Document Released: 05/08/2005 Document Revised: 07/31/2011 Document Reviewed: 08/20/2008 [facility] Patient Information 2014 [place], [place].  Achilles Tendinitis  with Rehab Achilles tendinitis is a disorder of the Achilles tendon. The Achilles tendon connects the large calf muscles (Gastrocnemius and Soleus) to the heel bone (calcaneus). This tendon is sometimes called the heel cord. It is important for pushing-off and standing on your toes and is important for walking, running, or jumping. Tendinitis is often caused by overuse and repetitive microtrauma. SYMPTOMS  Pain, tenderness, swelling, warmth, and redness may occur over the Achilles tendon even at rest.  Pain with pushing off, or flexing or extending the ankle.  Pain that is worsened after or during activity. CAUSES   Overuse sometimes seen with rapid increase in exercise programs or in sports requiring running and jumping.  Poor physical conditioning (strength and flexibility or endurance).  Running sports, especially training running down hills.  Inadequate warm-up before practice or play or failure to stretch before participation.  Injury to the tendon. PREVENTION   Warm up and stretch before practice or competition.  Allow time for adequate rest and recovery between practices and  competition.  Keep up conditioning.  Keep up ankle and leg flexibility.  Improve or keep muscle strength and endurance.  Improve cardiovascular fitness.  Use proper technique.  Use proper equipment (shoes, skates).  To help prevent recurrence, taping, protective strapping, or an adhesive bandage may be recommended for several weeks after healing is complete. PROGNOSIS   Recovery may take weeks to several months to heal.  Longer recovery is expected if symptoms have been prolonged.  Recovery is usually quicker if the inflammation is due to a direct blow as compared with overuse or sudden  strain. RELATED COMPLICATIONS   Healing time will be prolonged if the condition is not correctly treated. The injury must be given plenty of time to heal.  Symptoms can reoccur if activity is resumed too soon.  Untreated, tendinitis may increase the risk of tendon rupture requiring additional time for recovery and possibly surgery. TREATMENT   The first treatment consists of rest anti-inflammatory medication, and ice to relieve the pain.  Stretching and strengthening exercises after resolution of pain will likely help reduce the risk of recurrence. Referral to a physical therapist or athletic trainer for further evaluation and treatment may be helpful.  A walking boot or cast may be recommended to rest the Achilles tendon. This can help break the cycle of inflammation and microtrauma.  Arch supports (orthotics) may be prescribed or recommended by your caregiver as an adjunct to therapy and rest.  Surgery to remove the inflamed tendon lining or degenerated tendon tissue is rarely necessary and has shown less than predictable results. MEDICATION   Nonsteroidal anti-inflammatory medications, such as aspirin and ibuprofen, may be used for pain and inflammation relief. Do not take within 7 days before surgery. Take these as directed by your caregiver. Contact your caregiver immediately if any  bleeding, stomach upset, or signs of allergic reaction occur. Other minor pain relievers, such as acetaminophen, may also be used.  Pain relievers may be prescribed as necessary by your caregiver. Do not take prescription pain medication for longer than 4 to 7 days. Use only as directed and only as much as you need.  Cortisone injections are rarely indicated. Cortisone injections may weaken tendons and predispose to rupture. It is better to give the condition more time to heal than to use them. HEAT AND COLD  Cold is used to relieve pain and reduce inflammation for acute and chronic Achilles tendinitis. Cold should be applied for 10 to 15 minutes every 2 to 3 hours for inflammation and pain and immediately after any activity that aggravates your symptoms. Use ice packs or an ice massage.  Heat may be used before performing stretching and strengthening activities prescribed by your caregiver. Use a heat pack or a warm soak. SEEK MEDICAL CARE IF:  Symptoms get worse or do not improve in 2 weeks despite treatment.  New, unexplained symptoms develop. Drugs used in treatment may produce side effects.  EXERCISES:  RANGE OF MOTION (ROM) AND STRETCHING EXERCISES - Achilles Tendinitis  These exercises may help you when beginning to rehabilitate your injury. Your symptoms may resolve with or without further involvement from your physician, physical therapist or athletic trainer. While completing these exercises, remember:   Restoring tissue flexibility helps normal motion to return to the joints. This allows healthier, less painful movement and activity.  An effective stretch should be held for at least 30 seconds.  A stretch should never be painful. You should only feel a gentle lengthening or release in the stretched tissue.  STRETCH  Gastroc, Standing   Place hands on wall.  Extend right / left leg, keeping the front knee somewhat bent.  Slightly point your toes inward on your back  foot.  Keeping your right / left heel on the floor and your knee straight, shift your weight toward the wall, not allowing your back to arch.  You should feel a gentle stretch in the right / left calf. Hold this position for 10 seconds. Repeat 3 times. Complete this stretch 2 times per day.  STRETCH  Soleus, Standing   Place hands  on wall.  Extend right / left leg, keeping the other knee somewhat bent.  Slightly point your toes inward on your back foot.  Keep your right / left heel on the floor, bend your back knee, and slightly shift your weight over the back leg so that you feel a gentle stretch deep in your back calf.  Hold this position for 10 seconds. Repeat 3 times. Complete this stretch 2 times per day.  STRETCH  Gastrocsoleus, Standing  Note: This exercise can place a lot of stress on your foot and ankle. Please complete this exercise only if specifically instructed by your caregiver.   Place the ball of your right / left foot on a step, keeping your other foot firmly on the same step.  Hold on to the wall or a rail for balance.  Slowly lift your other foot, allowing your body weight to press your heel down over the edge of the step.  You should feel a stretch in your right / left calf.  Hold this position for 10 seconds.  Repeat this exercise with a slight bend in your knee. Repeat 3 times. Complete this stretch 2 times per day.   STRENGTHENING EXERCISES - Achilles Tendinitis These exercises may help you when beginning to rehabilitate your injury. They may resolve your symptoms with or without further involvement from your physician, physical therapist or athletic trainer. While completing these exercises, remember:   Muscles can gain both the endurance and the strength needed for everyday activities through controlled exercises.  Complete these exercises as instructed by your physician, physical therapist or athletic trainer. Progress the resistance and repetitions  only as guided.  You may experience muscle soreness or fatigue, but the pain or discomfort you are trying to eliminate should never worsen during these exercises. If this pain does worsen, stop and make certain you are following the directions exactly. If the pain is still present after adjustments, discontinue the exercise until you can discuss the trouble with your clinician.  STRENGTH - Plantar-flexors   Sit with your right / left leg extended. Holding onto both ends of a rubber exercise band/tubing, loop it around the ball of your foot. Keep a slight tension in the band.  Slowly push your toes away from you, pointing them downward.  Hold this position for 10 seconds. Return slowly, controlling the tension in the band/tubing. Repeat 3 times. Complete this exercise 2 times per day.   STRENGTH - Plantar-flexors   Stand with your feet shoulder width apart. Steady yourself with a wall or table using as little support as needed.  Keeping your weight evenly spread over the width of your feet, rise up on your toes.*  Hold this position for 10 seconds. Repeat 3 times. Complete this exercise 2 times per day.  *If this is too easy, shift your weight toward your right / left leg until you feel challenged. Ultimately, you may be asked to do this exercise with your right / left foot only.  STRENGTH  Plantar-flexors, Eccentric  Note: This exercise can place a lot of stress on your foot and ankle. Please complete this exercise only if specifically instructed by your caregiver.   Place the balls of your feet on a step. With your hands, use only enough support from a wall or rail to keep your balance.  Keep your knees straight and rise up on your toes.  Slowly shift your weight entirely to your right / left toes and pick up your opposite foot.  Gently and with controlled movement, lower your weight through your right / left foot so that your heel drops below the level of the step. You will feel a  slight stretch in the back of your calf at the end position.  Use the healthy leg to help rise up onto the balls of both feet, then lower weight only on the right / left leg again. Build up to 15 repetitions. Then progress to 3 consecutive sets of 15 repetitions.*  After completing the above exercise, complete the same exercise with a slight knee bend (about 30 degrees). Again, build up to 15 repetitions. Then progress to 3 consecutive sets of 15 repetitions.* Perform this exercise 2 times per day.  *When you easily complete 3 sets of 15, your physician, physical therapist or athletic trainer may advise you to add resistance by wearing a backpack filled with additional weight.  STRENGTH - Plantar Flexors, Seated   Sit on a chair that allows your feet to rest flat on the ground. If necessary, sit at the edge of the chair.  Keeping your toes firmly on the ground, lift your right / left heel as far as you can without increasing any discomfort in your ankle. Repeat 3 times. Complete this exercise 2 times a day.   Bunion  A bunion is a bump on the base of the big toe that forms when the bones of the big toe joint move out of position. Bunions may be small at first, but they often get larger over time. They can make walking painful. What are the causes? A bunion may be caused by:  Wearing narrow or pointed shoes that force the big toe to press against the other toes.  Abnormal foot development that causes the foot to roll inward (pronate).  Changes in the foot that are caused by certain diseases, such as rheumatoid arthritis or polio.  A foot injury. What increases the risk? The following factors may make you more likely to develop this condition:  Wearing shoes that squeeze the toes together.  Having certain diseases, such as: ? Rheumatoid arthritis. ? Polio. ? Cerebral palsy.  Having family members who have bunions.  Being born with a foot deformity, such as flat feet or low  arches.  Doing activities that put a lot of pressure on the feet, such as ballet dancing. What are the signs or symptoms? The main symptom of a bunion is a noticeable bump on the big toe. Other symptoms may include:  Pain.  Swelling around the big toe.  Redness and inflammation.  Thick or hardened skin on the big toe or between the toes.  Stiffness or loss of motion in the big toe.  Trouble with walking. How is this diagnosed? A bunion may be diagnosed based on your symptoms, medical history, and activities. You may have tests, such as:  X-rays. These allow your health care provider to check the position of the bones in your foot and look for damage to your joint. They also help your health care provider determine the severity of your bunion and the best way to treat it.  Joint aspiration. In this test, a sample of fluid is removed from the toe joint. This test may be done if you are in a lot of pain. It helps rule out diseases that cause painful swelling of the joints, such as arthritis. How is this treated? Treatment depends on the severity of your symptoms. The goal of treatment is to relieve symptoms and prevent  the bunion from getting worse. Your health care provider may recommend:  Wearing shoes that have a wide toe box.  Using bunion pads to cushion the affected area.  Taping your toes together to keep them in a normal position.  Placing a device inside your shoe (orthotics) to help reduce pressure on your toe joint.  Taking medicine to ease pain, inflammation, and swelling.  Applying heat or ice to the affected area.  Doing stretching exercises.  Surgery to remove scar tissue and move the toes back into their normal position. This treatment is rare. Follow these instructions at home: Managing pain, stiffness, and swelling   If directed, put ice on the painful area: ? Put ice in a plastic bag. ? Place a towel between your skin and the bag. ? Leave the ice on for  20 minutes, 2-3 times a day. Activity   If directed, apply heat to the affected area before you exercise. Use the heat source that your health care provider recommends, such as a moist heat pack or a heating pad. ? Place a towel between your skin and the heat source. ? Leave the heat on for 20-30 minutes. ? Remove the heat if your skin turns bright red. This is especially important if you are unable to feel pain, heat, or cold. You may have a greater risk of getting burned.  Do exercises as told by your health care provider. General instructions  Support your toe joint with proper footwear, shoe padding, or taping as told by your health care provider.  Take over-the-counter and prescription medicines only as told by your health care provider.  Keep all follow-up visits as told by your health care provider. This is important. Contact a health care provider if your symptoms:  Get worse.  Do not improve in 2 weeks. Get help right away if you have:  Severe pain and trouble with walking. Summary  A bunion is a bump on the base of the big toe that forms when the bones of the big toe joint move out of position.  Bunions can make walking painful.  Treatment depends on the severity of your symptoms.  Support your toe joint with proper footwear, shoe padding, or taping as told by your health care provider. This information is not intended to replace advice given to you by your health care provider. Make sure you discuss any questions you have with your health care provider. Document Released: 05/08/2005 Document Revised: 11/12/2017 Document Reviewed: 09/18/2017 Elsevier Patient Education  2020 [employer].

## 2019-03-01 ENCOUNTER — Encounter: Payer: Self-pay | Admitting: Podiatry

## 2019-03-10 ENCOUNTER — Telehealth: Payer: Self-pay | Admitting: *Deleted

## 2019-03-10 DIAGNOSIS — M2012 Hallux valgus (acquired), left foot: Secondary | ICD-10-CM | POA: Diagnosis not present

## 2019-03-10 DIAGNOSIS — M722 Plantar fascial fibromatosis: Secondary | ICD-10-CM

## 2019-03-10 MED ORDER — PROMETHAZINE HCL 25 MG PO TABS: 25.0000 mg | ORAL_TABLET | Freq: Four times a day (QID) | ORAL | 0 refills | Status: DC | PRN

## 2019-03-10 MED ORDER — OXYCODONE-ACETAMINOPHEN 10-325 MG PO TABS: 1.0000 | ORAL_TABLET | Freq: Four times a day (QID) | ORAL | 0 refills | Status: AC | PRN

## 2019-03-10 MED ORDER — IBUPROFEN 800 MG PO TABS: 800.0000 mg | ORAL_TABLET | Freq: Three times a day (TID) | ORAL | 1 refills | Status: DC | PRN

## 2019-03-10 NOTE — Telephone Encounter (Signed)
Pt states she had surgery with Dr. Posey Pronto today and is nauseous.

## 2019-03-10 NOTE — Telephone Encounter (Signed)
I spoke with pt and she states she was told she could take the ibuprofen and percocet together, and now she doesn't feel so good and is nauseous. I told her she could take them together, but the doctors preferred the ibuprofen be taken in between the percocet so as to keep the pain coverage at a continuum. I told pt I would ask Dr. Posey Pronto about phenergan for the nausea and it should be taking 30 minutes before the percocet and then take the percocet with a snack. Pt states she can't get comfortable with elevating the foot. I told pt the foot only had to be elevated about 6 inches above her hip and if she could not get comfortable to sleep she could sleep with the foot level with her hip, but not to have the foot below the heart more than 15 minutes/hour, and to sleep in the boot.

## 2019-03-11 ENCOUNTER — Telehealth: Payer: Self-pay | Admitting: Sports Medicine

## 2019-03-11 NOTE — Telephone Encounter (Signed)
Patient called answering service and felt like her bandage was too tight and cutting off circulation to her foot.  I advised patient to gently loosen the ace wrap to see if this would improve her symptoms if loosening the Ace wrap does not improve her symptoms advised patient to call back for further instructions.  Encourage patient to continue with rest ice elevation and pain medicine as prescribed.  Patient expressed understanding and thanks for me returning her phone call.

## 2019-03-11 NOTE — Telephone Encounter (Signed)
Sounds good thank you

## 2019-03-17 ENCOUNTER — Ambulatory Visit (INDEPENDENT_AMBULATORY_CARE_PROVIDER_SITE_OTHER): Payer: Medicaid Other | Admitting: Podiatry

## 2019-03-17 ENCOUNTER — Encounter: Payer: Self-pay | Admitting: Podiatry

## 2019-03-17 ENCOUNTER — Ambulatory Visit (INDEPENDENT_AMBULATORY_CARE_PROVIDER_SITE_OTHER): Payer: Medicaid Other

## 2019-03-17 ENCOUNTER — Other Ambulatory Visit: Payer: Self-pay

## 2019-03-17 DIAGNOSIS — M21612 Bunion of left foot: Secondary | ICD-10-CM

## 2019-03-17 DIAGNOSIS — Z9889 Other specified postprocedural states: Secondary | ICD-10-CM

## 2019-03-17 DIAGNOSIS — M2012 Hallux valgus (acquired), left foot: Secondary | ICD-10-CM

## 2019-03-17 NOTE — Progress Notes (Signed)
  Subjective:  Patient ID: Colleen Mills, female    DOB: Dec 05, 1965,  MRN: [id number]  Chief Complaint  Patient presents with  . Routine Post Op    DOS 03/10/2019 [facility] BUNIONECTOMY AND STEROID INJECTION LT  . Routine Post Op    patient states that she has doing really good and has taken the pain meds as needed.      53 y.o. female returns for post-op check.  She states that she is doing well.  She has no pain.  She has not been requiring pain medication.  She denies any nausea fever chills vomiting.  She has been traveling with a cam boot.  Review of Systems: Negative except as noted in the HPI. Denies N/V/F/Ch.  No past medical history on file.  Current Outpatient Medications:  .  ibuprofen (ADVIL) 800 MG tablet, Take 1 tablet (800 mg total) by mouth every 8 (eight) hours as needed., Disp: 60 tablet, Rfl: 1 .  oxyCODONE-acetaminophen (PERCOCET) 10-325 MG tablet, Take 1 tablet by mouth every 6 (six) hours as needed for up to 10 days for pain., Disp: 30 tablet, Rfl: 0 .  promethazine (PHENERGAN) 25 MG tablet, Take 1 tablet (25 mg total) by mouth every 6 (six) hours as needed for nausea or vomiting., Disp: 20 tablet, Rfl: 0  Social History   Tobacco Use  Smoking Status Former Smoker  . Packs/day: 0.50  . Types: Cigarettes  Smokeless Tobacco Never Used    Allergies  Allergen Reactions  . Aleve [Naproxen Sodium] Nausea And Vomiting    Patient states medication makes her sick, "Nausea and throwing up"   Objective:  There were no vitals filed for this visit. There is no height or weight on file to calculate BMI. Constitutional Well developed. Well nourished.  Vascular Foot warm and well perfused. Capillary refill normal to all digits.   Neurologic Normal speech. Oriented to person, place, and time. Epicritic sensation to light touch grossly present bilaterally.  Dermatologic Skin healing well without signs of infection. Skin edges well coapted without signs of infection.   Orthopedic: Tenderness to palpation noted about the surgical site.   Radiographs: 3 views of skeletally mature adult.  X-ray shows good correction of bunion deformity noted with equal reduction in IM angle.  Sesamoid position is in good alignment.  Screw position is good without any signs of loosening. Assessment:   1. Hallux abductovalgus with bunions, left    Plan:  Patient was evaluated and treated and all questions answered.  S/p foot surgery left -Progressing as expected post-operatively. -XR: See above -WB Status: Weightbearing as tolerated in cam boot -Sutures: Deep sutures are intact no signs of dehiscence noted.  No clinical signs of infection noted -Medications: None -Foot redressed.  No follow-ups on file.

## 2019-03-26 ENCOUNTER — Ambulatory Visit (INDEPENDENT_AMBULATORY_CARE_PROVIDER_SITE_OTHER): Payer: Self-pay | Admitting: Podiatry

## 2019-03-26 ENCOUNTER — Encounter: Payer: Self-pay | Admitting: Podiatry

## 2019-03-26 ENCOUNTER — Other Ambulatory Visit: Payer: Self-pay

## 2019-03-26 DIAGNOSIS — M2012 Hallux valgus (acquired), left foot: Secondary | ICD-10-CM

## 2019-03-26 DIAGNOSIS — Z9889 Other specified postprocedural states: Secondary | ICD-10-CM

## 2019-03-26 DIAGNOSIS — M21619 Bunion of unspecified foot: Secondary | ICD-10-CM

## 2019-03-26 DIAGNOSIS — M21612 Bunion of left foot: Secondary | ICD-10-CM

## 2019-03-26 NOTE — Progress Notes (Signed)
  Subjective:  Patient ID: Colleen Mills, female    DOB: [birth year],  MRN: [id number]  Chief Complaint  Patient presents with  . Routine Post Op    POV#2 DOS 03/10/2019 [facility] BUNIONECTOMY AND STEROID INJECTION LT    DOS: 03/10/2019 Procedure: Left foot [place] bunionectomy  53 y.o. female returns for post-op check.  Patient doing well.  No acute complaints.  Denies any nausea fever chills vomiting.  She has been ambulating with a cam boot no acute complaints.  Review of Systems: Negative except as noted in the HPI. Denies N/V/F/Ch.  No past medical history on file.  Current Outpatient Medications:  .  ibuprofen (ADVIL) 800 MG tablet, Take 1 tablet (800 mg total) by mouth every 8 (eight) hours as needed., Disp: 60 tablet, Rfl: 1 .  promethazine (PHENERGAN) 25 MG tablet, Take 1 tablet (25 mg total) by mouth every 6 (six) hours as needed for nausea or vomiting., Disp: 20 tablet, Rfl: 0  Social History   Tobacco Use  Smoking Status Former Smoker  . Packs/day: 0.50  . Types: Cigarettes  Smokeless Tobacco Never Used    Allergies  Allergen Reactions  . Aleve [Naproxen Sodium] Nausea And Vomiting    Patient states medication makes her sick, "Nausea and throwing up"   Objective:  There were no vitals filed for this visit. There is no height or weight on file to calculate BMI. Constitutional Well developed. Well nourished.  Vascular Foot warm and well perfused. Capillary refill normal to all digits.   Neurologic Normal speech. Oriented to person, place, and time. Epicritic sensation to light touch grossly present bilaterally.  Dermatologic Skin healing well without signs of infection. Skin edges well coapted without signs of infection.  Orthopedic: Tenderness to palpation noted about the surgical site.   Radiographs: None Assessment:   1. Bunion   2. Post-operative state   3. Hallux abductovalgus with bunions, left    Plan:  Patient was evaluated and treated and all  questions answered.  S/p foot surgery left -Progressing as expected post-operatively. -XR: None -WB Status: Weightbearing as tolerated in cam boot.  Patient will self transition herself into regular sneakers over the next 3 weeks. -Sutures: Sutures were cut flushed to skin.  No clinical signs of infection noted.  Steri-Strips intact. -Medications: None -Foot redressed.  I instructed the patient to shower in 1 week.  The Steri-Strips will self detached.  Return in about 3 weeks (around 04/16/2019) for POV-xray next visit.

## 2019-04-16 ENCOUNTER — Ambulatory Visit (INDEPENDENT_AMBULATORY_CARE_PROVIDER_SITE_OTHER): Payer: Medicaid Other

## 2019-04-16 ENCOUNTER — Ambulatory Visit (INDEPENDENT_AMBULATORY_CARE_PROVIDER_SITE_OTHER): Payer: Medicaid Other | Admitting: Podiatry

## 2019-04-16 ENCOUNTER — Other Ambulatory Visit: Payer: Self-pay

## 2019-04-16 DIAGNOSIS — M2012 Hallux valgus (acquired), left foot: Secondary | ICD-10-CM

## 2019-04-16 DIAGNOSIS — M21619 Bunion of unspecified foot: Secondary | ICD-10-CM

## 2019-04-16 DIAGNOSIS — M21612 Bunion of left foot: Secondary | ICD-10-CM

## 2019-04-16 DIAGNOSIS — Z9889 Other specified postprocedural states: Secondary | ICD-10-CM

## 2019-04-19 ENCOUNTER — Encounter: Payer: Self-pay | Admitting: Podiatry

## 2019-04-19 NOTE — Progress Notes (Signed)
  Subjective:  Patient ID: Colleen Mills, female    DOB: 06-03-1965,  MRN: [id number]  Chief Complaint  Patient presents with  . Routine Post Op    DOS 03/10/2019 [facility] BUNIONECTOMY AND STEROID INJECTION LT      53 y.o. female returns for post-op check.  Patient doing well.  Patient says that there is mild pain around the incision site.  Incision site has healed completely.  Patient states that there is some swelling that is causing her some pain as well.  She denies any other complaints.  She states that 2 days of Thursday she has been in regular shoes.  Review of Systems: Negative except as noted in the HPI. Denies N/V/F/Ch.  No past medical history on file.  Current Outpatient Medications:  .  ibuprofen (ADVIL) 800 MG tablet, Take 1 tablet (800 mg total) by mouth every 8 (eight) hours as needed., Disp: 60 tablet, Rfl: 1 .  promethazine (PHENERGAN) 25 MG tablet, Take 1 tablet (25 mg total) by mouth every 6 (six) hours as needed for nausea or vomiting., Disp: 20 tablet, Rfl: 0  Social History   Tobacco Use  Smoking Status Former Smoker  . Packs/day: 0.50  . Types: Cigarettes  Smokeless Tobacco Never Used    Allergies  Allergen Reactions  . Aleve [Naproxen Sodium] Nausea And Vomiting    Patient states medication makes her sick, "Nausea and throwing up"   Objective:  There were no vitals filed for this visit. There is no height or weight on file to calculate BMI. Constitutional Well developed. Well nourished.  Vascular Foot warm and well perfused. Capillary refill normal to all digits.   Neurologic Normal speech. Oriented to person, place, and time. Epicritic sensation to light touch grossly present bilaterally.  Dermatologic Skin healing well without signs of infection. Skin edges well coapted without signs of infection.  Orthopedic: Tenderness to palpation noted about the surgical site.   Radiographs: 2 views of skeletally mature adult foot: No signs of hardware  loosening noted.  Hardware is intact.  Good alignment noted. Assessment:   1. Hallux abductovalgus with bunions, left    Plan:  Patient was evaluated and treated and all questions answered.  S/p foot surgery left -Progressing as expected post-operatively. -XR: See above -WB Status: Weightbearing as tolerated in regular shoes -Sutures: Skin incision is completely reepithelialized.  No clinical signs of infection noted. -Medications: None -Foot redressed. Ace bandage was given to help address the swelling and therefore relieve pain.  Return in about 6 weeks (around 05/28/2019).

## 2019-05-28 ENCOUNTER — Ambulatory Visit (INDEPENDENT_AMBULATORY_CARE_PROVIDER_SITE_OTHER): Payer: Medicaid Other

## 2019-05-28 ENCOUNTER — Other Ambulatory Visit: Payer: Self-pay

## 2019-05-28 ENCOUNTER — Ambulatory Visit (INDEPENDENT_AMBULATORY_CARE_PROVIDER_SITE_OTHER): Payer: Medicaid Other | Admitting: Podiatry

## 2019-05-28 DIAGNOSIS — Z9889 Other specified postprocedural states: Secondary | ICD-10-CM

## 2019-05-28 DIAGNOSIS — M21619 Bunion of unspecified foot: Secondary | ICD-10-CM

## 2019-05-28 DIAGNOSIS — M21612 Bunion of left foot: Secondary | ICD-10-CM

## 2019-05-28 DIAGNOSIS — M2012 Hallux valgus (acquired), left foot: Secondary | ICD-10-CM | POA: Diagnosis not present

## 2019-05-28 NOTE — Progress Notes (Signed)
  Subjective:  Patient ID: Colleen Mills, female    DOB: 05/19/1966,  MRN: [id number]  Chief Complaint  Patient presents with  . Foot Pain    DOS 03/10/2019 [facility] BUNIONECTOMY AND STEROID INJECTION LT     54 y.o. female returns for post-op check. Patient states she is doing well. Mild shooting and tingling pain sporadically. No orther complaints. Patient is happy with the surgery over all.  Review of Systems: Negative except as noted in the HPI. Denies N/V/F/Ch.  No past medical history on file.  Current Outpatient Medications:  .  ibuprofen (ADVIL) 800 MG tablet, Take 1 tablet (800 mg total) by mouth every 8 (eight) hours as needed., Disp: 60 tablet, Rfl: 1 .  promethazine (PHENERGAN) 25 MG tablet, Take 1 tablet (25 mg total) by mouth every 6 (six) hours as needed for nausea or vomiting., Disp: 20 tablet, Rfl: 0  Social History   Tobacco Use  Smoking Status Former Smoker  . Packs/day: 0.50  . Types: Cigarettes  Smokeless Tobacco Never Used    Allergies  Allergen Reactions  . Aleve [Naproxen Sodium] Nausea And Vomiting    Patient states medication makes her sick, "Nausea and throwing up"   Objective:  There were no vitals filed for this visit. There is no height or weight on file to calculate BMI. Constitutional Well developed. Well nourished.  Vascular Foot warm and well perfused. Capillary refill normal to all digits.   Neurologic Normal speech. Oriented to person, place, and time. Epicritic sensation to light touch grossly present bilaterally.  Dermatologic Skin healing well without signs of infection. Skin edges well coapted without signs of infection.  Orthopedic:  No pain on palpation to the surgical site.  Good range of motion noted at the first metatarsophalangeal joint active and passive.   Radiographs: 2 views of skeletally mature adult foot: Good correction and alignment noted.  Hardware is intact.  Good consolidation noted across the osteotomy  site. Assessment:   1. Hallux abductovalgus with bunions, left   2. Post-operative state   3. Bunion    Plan:  Patient was evaluated and treated and all questions answered.  S/p foot surgery left -Progressing as expected post-operatively. -XR: See above -WB Status: Ambulating in regular sneakers.  -Sutures: Skin has completely reepithelialized -Medications: None -Foot redressed.  Return if symptoms worsen or fail to improve.

## 2019-05-29 ENCOUNTER — Encounter: Payer: Self-pay | Admitting: Podiatry

## 2019-06-23 ENCOUNTER — Ambulatory Visit: Payer: Medicaid Other | Attending: Internal Medicine

## 2019-06-23 DIAGNOSIS — Z20822 Contact with and (suspected) exposure to covid-19: Secondary | ICD-10-CM

## 2019-06-24 LAB — NOVEL CORONAVIRUS, NAA: SARS-CoV-2, NAA: NOT DETECTED

## 2019-07-31 ENCOUNTER — Other Ambulatory Visit: Payer: Self-pay | Admitting: Family Medicine

## 2019-07-31 DIAGNOSIS — Z1231 Encounter for screening mammogram for malignant neoplasm of breast: Secondary | ICD-10-CM

## 2019-08-26 ENCOUNTER — Ambulatory Visit: Payer: Medicaid Other

## 2019-09-05 ENCOUNTER — Other Ambulatory Visit: Payer: Self-pay | Admitting: Podiatry

## 2019-09-05 ENCOUNTER — Ambulatory Visit: Payer: Medicaid Other | Admitting: Podiatry

## 2019-09-05 ENCOUNTER — Other Ambulatory Visit: Payer: Self-pay

## 2019-09-05 ENCOUNTER — Ambulatory Visit (INDEPENDENT_AMBULATORY_CARE_PROVIDER_SITE_OTHER): Payer: Medicaid Other

## 2019-09-05 DIAGNOSIS — M7661 Achilles tendinitis, right leg: Secondary | ICD-10-CM

## 2019-09-05 DIAGNOSIS — M7672 Peroneal tendinitis, left leg: Secondary | ICD-10-CM

## 2019-09-05 DIAGNOSIS — M7662 Achilles tendinitis, left leg: Secondary | ICD-10-CM

## 2019-09-05 DIAGNOSIS — M21619 Bunion of unspecified foot: Secondary | ICD-10-CM

## 2019-09-05 DIAGNOSIS — M216X2 Other acquired deformities of left foot: Secondary | ICD-10-CM | POA: Diagnosis not present

## 2019-09-09 ENCOUNTER — Encounter: Payer: Self-pay | Admitting: Podiatry

## 2019-09-09 NOTE — Progress Notes (Signed)
Subjective:  Patient ID: Colleen Mills, female    DOB: 09/10/1965,  MRN: [id number]  Chief Complaint  Patient presents with  . Ankle Pain    pt is here for left ankle pain, located on the lateral side, pain has been going on for about 4 days. Pt states that pain is elevated to the lateral side, pt also states that the left ankle does not have full ROM    54 y.o. female presents with the above complaint.  Patient presents with left lateral ankle pain after undergoing injury 4 days ago.  Patient states the pain has gotten significantly worse.  Pain when walking on it.  Patient states that there is some swelling associated with it.  Pain scale 7 out of 10.  However bunion surgery is doing really well.  Patient denies any acute complaints from the bunion surgery.  She denies any other acute treatment injury.  This is a new acute problem.  She has not seen anyone else prior to seeing me.   Review of Systems: Negative except as noted in the HPI. Denies N/V/F/Ch.  No past medical history on file.  Current Outpatient Medications:  .  ibuprofen (ADVIL) 800 MG tablet, Take 1 tablet (800 mg total) by mouth every 8 (eight) hours as needed., Disp: 60 tablet, Rfl: 1 .  promethazine (PHENERGAN) 25 MG tablet, Take 1 tablet (25 mg total) by mouth every 6 (six) hours as needed for nausea or vomiting., Disp: 20 tablet, Rfl: 0  Social History   Tobacco Use  Smoking Status Former Smoker  . Packs/day: 0.50  . Types: Cigarettes  Smokeless Tobacco Never Used    Allergies  Allergen Reactions  . Aleve [Naproxen Sodium] Nausea And Vomiting    Patient states medication makes her sick, "Nausea and throwing up"   Objective:  There were no vitals filed for this visit. There is no height or weight on file to calculate BMI. Constitutional Well developed. Well nourished.  Vascular Dorsalis pedis pulses palpable bilaterally. Posterior tibial pulses palpable bilaterally. Capillary refill normal to all digits.    No cyanosis or clubbing noted. Pedal hair growth normal.  Neurologic Normal speech. Oriented to person, place, and time. Epicritic sensation to light touch grossly present bilaterally.  Dermatologic Nails well groomed and normal in appearance. No open wounds. No skin lesions.  Orthopedic:  pain on palpation along the course of the peroneal tendon.  Pain with inversion and plantarflexion of the foot.  Pain relieved with dorsiflexion and eversion of the foot.  This is both in active and passive range of motion.  Good range of motion noted of the first metatarsophalangeal joint.  No pain on palpation to the previous bunion surgery.   Radiographs: 3 views of skeletally mature adult left foot/Ankle: Hardware is intact without any signs of loosening.  Mild ankle arthritis noted.  No other bony abnormalities noted.  Incidental finding of posterior and plantar heel spurring noted.  Good correction of the bunion noted.  No fractures noted. Assessment:   1. Peroneal tendinitis, left   2. Bunion    Plan:  Patient was evaluated and treated and all questions answered.  Left peroneal tendinitis -I explained to the patient the etiology of peroneal tendinitis and various treatment options were extensively discussed.  Given that patient has moderate to severe pain with associated swelling.  There is likely a contusion of the tendon especially during a fall.  I believe patient will benefit from embolization of the cam boot.  She already has cam boot at home and I have asked her to place her self in it for next 4 weeks.  Patient states understanding and will do so. -I will see her back again in 4 weeks and reevaluate.  If no improvement then will consider getting an MRI.   No follow-ups on file.

## 2019-12-17 ENCOUNTER — Ambulatory Visit: Payer: Medicaid Other

## 2020-03-22 DIAGNOSIS — U071 COVID-19: Secondary | ICD-10-CM

## 2020-03-22 HISTORY — DX: COVID-19: U07.1

## 2020-03-28 ENCOUNTER — Encounter (HOSPITAL_COMMUNITY): Payer: Self-pay

## 2020-03-28 ENCOUNTER — Other Ambulatory Visit: Payer: Self-pay

## 2020-03-28 ENCOUNTER — Ambulatory Visit (HOSPITAL_COMMUNITY)
Admission: EM | Admit: 2020-03-28 | Discharge: 2020-03-28 | Disposition: A | Discharge status: Home / Self Care | Payer: Medicaid Other | Attending: Urgent Care | Admitting: Urgent Care

## 2020-03-28 DIAGNOSIS — Z87891 Personal history of nicotine dependence: Secondary | ICD-10-CM | POA: Insufficient documentation

## 2020-03-28 DIAGNOSIS — R0981 Nasal congestion: Secondary | ICD-10-CM | POA: Diagnosis present

## 2020-03-28 DIAGNOSIS — J3489 Other specified disorders of nose and nasal sinuses: Secondary | ICD-10-CM

## 2020-03-28 DIAGNOSIS — U071 COVID-19: Secondary | ICD-10-CM | POA: Insufficient documentation

## 2020-03-28 DIAGNOSIS — J069 Acute upper respiratory infection, unspecified: Secondary | ICD-10-CM | POA: Diagnosis present

## 2020-03-28 MED ORDER — CETIRIZINE HCL 10 MG PO TABS: 10.0000 mg | ORAL_TABLET | Freq: Every day | ORAL | 0 refills | Status: DC

## 2020-03-28 MED ORDER — BENZONATATE 100 MG PO CAPS: 100.0000 mg | ORAL_CAPSULE | Freq: Three times a day (TID) | ORAL | 0 refills | Status: DC | PRN

## 2020-03-28 MED ORDER — PREDNISONE 20 MG PO TABS: ORAL_TABLET | ORAL | 0 refills | Status: DC

## 2020-03-28 MED ORDER — PROMETHAZINE-DM 6.25-15 MG/5ML PO SYRP: 5.0000 mL | ORAL_SOLUTION | Freq: Every evening | ORAL | 0 refills | Status: DC | PRN

## 2020-03-28 NOTE — ED Triage Notes (Signed)
Pt presents with cough, nasal congestion, headache and body aches x 4 days. Mucinex and Afrin gives no relief. Denies fever, sob.

## 2020-03-28 NOTE — ED Provider Notes (Signed)
[facility]   MRN: [id number] DOB: [birth year]  Subjective:   Colleen Mills is a 54 y.o. female presenting for 4 day hx of acute onset severe sinus congestion, sinus pressure, postnasal drainage that causes nausea and a cough.  Patient has been using Afrin multiple times a day sometimes multiple times an hour.  She is also been using Mucinex.  Denies fever, chest pain, shortness of breath, body aches, loss of sense of taste and smell.  She has been Covid vaccinated.  Denies smoking cigarettes, history of asthma or respiratory disorders.  Denies having any chronic conditions.  Denies taking chronic medications.  Allergies  Allergen Reactions  . Aleve [Naproxen Sodium] Nausea And Vomiting    Patient states medication makes her sick, "Nausea and throwing up"    History reviewed. No pertinent past medical history.   Past Surgical History:  Procedure Laterality Date  . HERNIA REPAIR     as a child    Family History  Problem Relation Age of Onset  . Hypertension Other     Social History   Tobacco Use  . Smoking status: Former Smoker    Packs/day: 0.50    Types: Cigarettes  . Smokeless tobacco: Never Used  Substance Use Topics  . Alcohol use: Yes    Comment: occ  . Drug use: No    ROS   Objective:   Vitals: BP 125/85 (BP Location: Left Arm)   Pulse 84   Temp 99.2 F (37.3 C) (Oral)   Resp 18   SpO2 96%   Physical Exam Constitutional:      General: She is not in acute distress.    Appearance: Normal appearance. She is well-developed. She is not ill-appearing, toxic-appearing or diaphoretic.  HENT:     Head: Normocephalic and atraumatic.     Right Ear: Tympanic membrane and ear canal normal. No drainage or tenderness. No middle ear effusion. Tympanic membrane is not erythematous.     Left Ear: Tympanic membrane and ear canal normal. No drainage or tenderness.  No middle ear effusion. Tympanic membrane is not erythematous.     Nose: Congestion  and rhinorrhea present.     Mouth/Throat:     Mouth: Mucous membranes are moist. No oral lesions.     Pharynx: No pharyngeal swelling, oropharyngeal exudate, posterior oropharyngeal erythema or uvula swelling.     Tonsils: No tonsillar exudate or tonsillar abscesses.     Comments: Postnasal drainage.  Eyes:     General: No scleral icterus.       Right eye: No discharge.        Left eye: No discharge.     Extraocular Movements: Extraocular movements intact.     Right eye: Normal extraocular motion.     Left eye: Normal extraocular motion.     Conjunctiva/sclera: Conjunctivae normal.     Pupils: Pupils are equal, round, and reactive to light.  Cardiovascular:     Rate and Rhythm: Normal rate and regular rhythm.     Pulses: Normal pulses.     Heart sounds: Normal heart sounds. No murmur heard.  No friction rub. No gallop.   Pulmonary:     Effort: Pulmonary effort is normal. No respiratory distress.     Breath sounds: Normal breath sounds. No stridor. No wheezing, rhonchi or rales.  Musculoskeletal:     Cervical back: Normal range of motion and neck supple.  Lymphadenopathy:     Cervical: No cervical adenopathy.  Skin:  General: Skin is warm and dry.     Findings: No rash.  Neurological:     General: No focal deficit present.     Mental Status: She is alert and oriented to person, place, and time.  Psychiatric:        Mood and Affect: Mood normal.        Behavior: Behavior normal.        Thought Content: Thought content normal.        Judgment: Judgment normal.       Assessment and Plan :   PDMP not reviewed this encounter.  1. Viral URI with cough   2. Nasal congestion   3. Sinus pressure     Emphasized need to stop using Afrin spray.  Will use prednisone course to counteract its effects.  Recommend supportive care otherwise including Tessalon, cough syrup, Zyrtec.  COVID-19 testing pending. Counseled patient on potential for adverse effects with medications  prescribed/recommended today, ER and return-to-clinic precautions discussed, patient verbalized understanding.    Wallis Bamberg, PA-C 03/28/20 1109

## 2020-03-28 NOTE — Discharge Instructions (Signed)

## 2020-03-29 LAB — SARS CORONAVIRUS 2 (TAT 6-24 HRS): SARS Coronavirus 2: POSITIVE — AB

## 2020-03-30 ENCOUNTER — Encounter: Payer: Self-pay | Admitting: Nurse Practitioner

## 2020-03-30 ENCOUNTER — Telehealth: Payer: Self-pay | Admitting: Nurse Practitioner

## 2020-03-30 NOTE — Telephone Encounter (Signed)
I called Colleen Mills to discuss Covid symptoms and the use of Sotrovimab, a monoclonal antibody infusion for those with mild to moderate Covid symptoms and at a high risk of hospitalization.     Pt is qualified for this infusion at the monoclonal antibody infusion center due to co-morbid conditions and/or a member of an at-risk group, however would like to think more about the infusion at this time. Symptoms tier reviewed as well as criteria for ending isolation.  Symptoms reviewed that would warrant ED/Hospital evaluation. Preventative practices reviewed. Patient verbalized understanding. Patient advised to call back if he decides that he does want to get infusion. Callback number to the infusion center given. Patient advised to go to Urgent care or ED with severe symptoms. Last date pt would be eligible for infusion is 11/13.    Patient Active Problem List   Diagnosis Date Noted  . Morbid obesity (HCC)   . COVID-19 virus infection 03/2020    Nicolasa Ducking, NP

## 2020-08-24 ENCOUNTER — Other Ambulatory Visit: Payer: Self-pay | Admitting: Family Medicine

## 2020-08-24 DIAGNOSIS — Z1231 Encounter for screening mammogram for malignant neoplasm of breast: Secondary | ICD-10-CM

## 2020-08-25 ENCOUNTER — Inpatient Hospital Stay: Admission: RE | Admit: 2020-08-25 | Payer: Medicaid Other | Source: Ambulatory Visit

## 2020-11-16 ENCOUNTER — Inpatient Hospital Stay: Admission: RE | Admit: 2020-11-16 | Payer: Medicaid Other | Source: Ambulatory Visit

## 2021-03-14 ENCOUNTER — Other Ambulatory Visit: Payer: Self-pay

## 2021-03-14 ENCOUNTER — Ambulatory Visit (HOSPITAL_COMMUNITY)
Admission: EM | Admit: 2021-03-14 | Discharge: 2021-03-14 | Disposition: A | Discharge status: Home / Self Care | Payer: Medicaid Other | Attending: Emergency Medicine | Admitting: Emergency Medicine

## 2021-03-14 ENCOUNTER — Encounter (HOSPITAL_COMMUNITY): Payer: Self-pay | Admitting: Emergency Medicine

## 2021-03-14 DIAGNOSIS — H66001 Acute suppurative otitis media without spontaneous rupture of ear drum, right ear: Secondary | ICD-10-CM | POA: Diagnosis not present

## 2021-03-14 MED ORDER — AMOXICILLIN 500 MG PO CAPS: 500.0000 mg | ORAL_CAPSULE | Freq: Two times a day (BID) | ORAL | 0 refills | Status: AC

## 2021-03-14 NOTE — ED Provider Notes (Signed)
[facility]    CSN: [id number] Arrival date & time: 03/14/21  1010      History   Chief Complaint Chief Complaint  Patient presents with   Headache   Dizziness   Ear Fullness    HPI Colleen Mills is a 55 y.o. female.   Patient here for evaluation of a right ear pain and fullness, dizziness, and nausea that has been ongoing for the past several days.  Denies any vomiting or diarrhea.  Has not taken any OTC medications or treatments.  Reports right ear pain is a throbbing and worse when laying down.  Denies any trauma, injury, or other precipitating event.  Denies any specific alleviating or aggravating factors.  Denies any fevers, chest pain, shortness of breath, numbness, tingling, weakness, abdominal pain, or headaches.    The history is provided by the patient.  Headache Associated symptoms: dizziness and ear pain   Dizziness Associated symptoms: headaches   Ear Fullness Associated symptoms include headaches.   Past Medical History:  Diagnosis Date   COVID-19 virus infection 03/2020   Morbid obesity [facility])     Patient Active Problem List   Diagnosis Date Noted   Morbid obesity (HCC)    COVID-19 virus infection 03/2020    Past Surgical History:  Procedure Laterality Date   HERNIA REPAIR     as a child    OB History   No obstetric history on file.      Home Medications    Prior to Admission medications   Medication Sig Start Date End Date Taking? Authorizing Provider  amoxicillin (AMOXIL) 500 MG capsule Take 1 capsule (500 mg total) by mouth 2 (two) times daily for 7 days. 03/14/21 03/21/21 Yes Ivette Loyal, NP  benzonatate (TESSALON) 100 MG capsule Take 1-2 capsules (100-200 mg total) by mouth 3 (three) times daily as needed for cough. 03/28/20   Wallis Bamberg, PA-C  cetirizine (ZYRTEC ALLERGY) 10 MG tablet Take 1 tablet (10 mg total) by mouth daily. 03/28/20   Wallis Bamberg, PA-C  dextromethorphan-guaiFENesin [facility] DM) 30-600 MG 12hr tablet  Take 1 tablet by mouth 2 (two) times daily.    [provider]  ibuprofen (ADVIL) 800 MG tablet Take 1 tablet (800 mg total) by mouth every 8 (eight) hours as needed. 03/10/19   Candelaria Stagers, DPM  oxymetazoline (AFRIN) 0.05 % nasal spray Place 1 spray into both nostrils 2 (two) times daily.    [provider]  predniSONE (DELTASONE) 20 MG tablet Take 2 tablets daily with breakfast. 03/28/20   Wallis Bamberg, PA-C  promethazine (PHENERGAN) 25 MG tablet Take 1 tablet (25 mg total) by mouth every 6 (six) hours as needed for nausea or vomiting. 03/10/19   Candelaria Stagers, DPM  promethazine-dextromethorphan (PROMETHAZINE-DM) 6.25-15 MG/5ML syrup Take 5 mLs by mouth at bedtime as needed for cough. 03/28/20   Wallis Bamberg, PA-C    Family History Family History  Problem Relation Age of Onset   Hypertension Mother    Hypertension Other     Social History Social History   Tobacco Use   Smoking status: Former    Packs/day: 0.50    Types: Cigarettes   Smokeless tobacco: Never  Substance Use Topics   Alcohol use: Yes    Comment: occ   Drug use: No     Allergies   Aleve [naproxen sodium]   Review of Systems Review of Systems  HENT:  Positive for ear pain.   Neurological:  Positive  for dizziness and headaches.  All other systems reviewed and are negative.   Physical Exam Triage Vital Signs ED Triage Vitals  Enc Vitals Group     BP 03/14/21 1214 123/86     Pulse Rate 03/14/21 1214 76     Resp 03/14/21 1214 17     Temp 03/14/21 1214 98.3 F (36.8 C)     Temp Source 03/14/21 1214 Oral     SpO2 03/14/21 1214 97 %     Weight --      Height --      Head Circumference --      Peak Flow --      Pain Score 03/14/21 1212 0     Pain Loc --      Pain Edu? --      Excl. in GC? --    No data found.  Updated Vital Signs BP 123/86 (BP Location: Left Arm)   Pulse 76   Temp 98.3 F (36.8 C) (Oral)   Resp 17   SpO2 97%   Visual Acuity Right Eye Distance:   Left Eye  Distance:   Bilateral Distance:    Right Eye Near:   Left Eye Near:    Bilateral Near:     Physical Exam Vitals and nursing note reviewed.  Constitutional:      General: She is not in acute distress.    Appearance: Normal appearance. She is not ill-appearing, toxic-appearing or diaphoretic.  HENT:     Head: Normocephalic and atraumatic.     Right Ear: Tenderness present. Tympanic membrane is injected, erythematous and bulging.     Left Ear: Ear canal and external ear normal. Tympanic membrane is injected.  Eyes:     Conjunctiva/sclera: Conjunctivae normal.  Cardiovascular:     Rate and Rhythm: Normal rate.     Pulses: Normal pulses.     Heart sounds: Normal heart sounds.  Pulmonary:     Effort: Pulmonary effort is normal.     Breath sounds: Normal breath sounds.  Abdominal:     General: Abdomen is flat.  Musculoskeletal:        General: Normal range of motion.     Cervical back: Normal range of motion.  Skin:    General: Skin is warm and dry.  Neurological:     General: No focal deficit present.     Mental Status: She is alert and oriented to person, place, and time.     GCS: GCS eye subscore is 4. GCS verbal subscore is 5. GCS motor subscore is 6.     Cranial Nerves: No dysarthria or facial asymmetry.     Sensory: No sensory deficit.     Motor: No weakness.  Psychiatric:        Mood and Affect: Mood normal.     UC Treatments / Results  Labs (all labs ordered are listed, but only abnormal results are displayed) Labs Reviewed - No data to display  EKG   Radiology No results found.  Procedures Procedures (including critical care time)  Medications Ordered in UC Medications - No data to display  Initial Impression / Assessment and Plan / UC Course  I have reviewed the triage vital signs and the nursing notes.  Pertinent labs & imaging results that were available during my care of the patient were reviewed by me and considered in my medical decision making  (see chart for details).    Assessment negative for red flags or concerns.  Otitis media of  the right ear.  Will treat with amoxicillin twice daily for the next 7 days.  Tylenol and or ibuprofen as needed.  Encourage fluids and rest.  Follow-up as needed.  Final Clinical Impressions(s) / UC Diagnoses   Final diagnoses:  Non-recurrent acute suppurative otitis media of right ear without spontaneous rupture of tympanic membrane     Discharge Instructions      Take the amoxicillin 1 pill twice a day for the next 7 days.  You can take Tylenol and/or ibuprofen as needed for pain relief and fever reduction. Make sure you are drinking plenty of fluids, especially water.  Return or go to the Emergency Department if symptoms worsen or do not improve in the next few days.      ED Prescriptions     Medication Sig Dispense Auth. Provider   amoxicillin (AMOXIL) 500 MG capsule Take 1 capsule (500 mg total) by mouth 2 (two) times daily for 7 days. 14 capsule Ivette Loyal, NP      PDMP not reviewed this encounter.   Ivette Loyal, NP 03/14/21 [phone]

## 2021-03-14 NOTE — Discharge Instructions (Signed)
Take the amoxicillin 1 pill twice a day for the next 7 days.  You can take Tylenol and/or ibuprofen as needed for pain relief and fever reduction. Make sure you are drinking plenty of fluids, especially water.  Return or go to the Emergency Department if symptoms worsen or do not improve in the next few days.

## 2021-03-14 NOTE — ED Triage Notes (Signed)
Pt presents with dizziness, "ear thumping", and headache that started yesterday. Denies use of OTC medications.

## 2021-08-12 ENCOUNTER — Ambulatory Visit: Payer: Medicaid Other | Admitting: Family

## 2022-03-22 ENCOUNTER — Encounter (HOSPITAL_COMMUNITY): Payer: Self-pay

## 2022-03-22 ENCOUNTER — Ambulatory Visit (HOSPITAL_COMMUNITY)
Admission: EM | Admit: 2022-03-22 | Discharge: 2022-03-22 | Disposition: A | Discharge status: Home / Self Care | Payer: Medicaid Other

## 2022-03-22 DIAGNOSIS — T148XXA Other injury of unspecified body region, initial encounter: Secondary | ICD-10-CM

## 2022-03-22 DIAGNOSIS — M79602 Pain in left arm: Secondary | ICD-10-CM

## 2022-03-22 MED ORDER — CYCLOBENZAPRINE HCL 10 MG PO TABS: 10.0000 mg | ORAL_TABLET | Freq: Two times a day (BID) | ORAL | 0 refills | Status: DC | PRN

## 2022-03-22 MED ORDER — ACETAMINOPHEN 325 MG PO TABS
ORAL_TABLET | ORAL | Status: AC
  Filled 2022-03-22: qty 3

## 2022-03-22 MED ORDER — METHYLPREDNISOLONE SODIUM SUCC 125 MG IJ SOLR
INTRAMUSCULAR | Status: AC
  Filled 2022-03-22: qty 2

## 2022-03-22 MED ORDER — ACETAMINOPHEN 325 MG PO TABS
975.0000 mg | ORAL_TABLET | Freq: Once | ORAL | Status: AC
  Administered 2022-03-22: 975 mg via ORAL

## 2022-03-22 MED ORDER — PREDNISONE 20 MG PO TABS: 40.0000 mg | ORAL_TABLET | Freq: Every day | ORAL | 0 refills | Status: AC

## 2022-03-22 MED ORDER — METHYLPREDNISOLONE SODIUM SUCC 125 MG IJ SOLR
80.0000 mg | Freq: Once | INTRAMUSCULAR | Status: AC
  Administered 2022-03-22: 80 mg via INTRAMUSCULAR

## 2022-03-22 NOTE — Discharge Instructions (Signed)
You have been evaluated in the today for your left arm pain. Your pain is most likely muscle strain which will improve on its own with time.   Take prednisone with food 40mg  per day starting tomorrow.  We gave you an injection of steroid in the clinic to help your pain and some tylenol.  You may also take flexeril muscle relaxer every 12 hours for muscle spasm.  Do not take this medication and drive or drink alcohol as it can make you sleepy.  Mainly use this medicine at nighttime as needed.  Apply heat and perform gentle range of motion exercises to the area of greatest pain to prevent muscle stiffness and provide further pain relief.   Call the orthopedic provider listed on your paper work Raliegh Ip) to schedule an appointment for follow-up evaluation.  If you develop any new or worsening symptoms, please return to urgent care.  If your symptoms are severe, please go to the emergency room.  I hope you feel better!

## 2022-03-22 NOTE — ED Triage Notes (Signed)
Pain in the left arm x 3 weeks. States the pain keeps her up at night. Unable to fully rotate the left shoulder, states pain is going down her back now.  No falls, states at work she was pushing a large trash bin.  No medical history of injuries to the left arm/shoulder.

## 2022-03-22 NOTE — ED Provider Notes (Signed)
[facility]    CSN: [id number] Arrival date & time: 03/22/22  1940      History   Chief Complaint Chief Complaint  Patient presents with   Arm Pain    HPI Colleen Mills is a 56 y.o. female.   Patient presents to urgent care for evaluation of left arm pain that started 3 weeks ago. Pain to the left arm feels like a throbbing sensation and travels from the left wrist to the forearm, then sometimes to the bicep area and sometimes to the hand depending on the day and activity. Pain wakes patient up from sleep at night and she has attempted use of tennis elbow braces/carpal tunnel braces without relief. 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 Cleaning makes the pain significantly worse to the generalized left arm. She denies numbness/tingling to the upper extremities, recent falls/trauma to the left arm or neck, chest pain, shortness of breath, dizziness, nausea, fatigue, jaw pain, diaphoresis, and cramping/locking up sensation to the left arm and hand. She has been taking leftover methylprednisolone tablets intermittently in attempt to help the pain without much relief and states tylenol is not helping either.      Arm Pain    Past Medical History:  Diagnosis Date   COVID-19 virus infection 03/2020   Morbid obesity [facility])     Patient Active Problem List   Diagnosis Date Noted   Morbid obesity ([place])    COVID-19 virus infection 03/2020    Past Surgical History:  Procedure Laterality Date   HERNIA REPAIR     as a child    OB History   No obstetric history on file.      Home Medications    Prior to Admission medications   Medication Sig Start Date End Date Taking? Authorizing Provider  cyclobenzaprine (FLEXERIL) 10 MG tablet Take 1 tablet (10 mg total) by mouth 2 (two) times daily as needed for muscle spasms. 03/22/22  Yes Talbot Grumbling, FNP   predniSONE (DELTASONE) 20 MG tablet Take 2 tablets (40 mg total) by mouth daily for 5 doses. 03/22/22 03/27/22 Yes Deaisa Merida, Stasia Cavalier, FNP  benzonatate (TESSALON) 100 MG capsule Take 1-2 capsules (100-200 mg total) by mouth 3 (three) times daily as needed for cough. 03/28/20   Jaynee Eagles, PA-C  cetirizine (ZYRTEC ALLERGY) 10 MG tablet Take 1 tablet (10 mg total) by mouth daily. 03/28/20   Jaynee Eagles, PA-C  dextromethorphan-guaiFENesin [facility] DM) 30-600 MG 12hr tablet Take 1 tablet by mouth 2 (two) times daily.    [provider]  ibuprofen (ADVIL) 800 MG tablet Take 1 tablet (800 mg total) by mouth every 8 (eight) hours as needed. 03/10/19   Felipa Furnace, DPM  oxymetazoline (AFRIN) 0.05 % nasal spray Place 1 spray into both nostrils 2 (two) times daily.    [provider]  promethazine (PHENERGAN) 25 MG tablet Take 1 tablet (25 mg total) by mouth every 6 (six) hours as needed for nausea or vomiting. 03/10/19   Felipa Furnace, DPM  promethazine-dextromethorphan (PROMETHAZINE-DM) 6.25-15 MG/5ML syrup Take 5 mLs by mouth at bedtime as needed for cough. 03/28/20   Jaynee Eagles, PA-C    Family History Family History  Problem Relation Age of Onset   Hypertension Mother    Hypertension Other     Social History Social History   Tobacco Use   Smoking status: Former  Packs/day: 0.50    Types: Cigarettes   Smokeless tobacco: Never  Substance Use Topics   Alcohol use: Yes    Comment: occ   Drug use: No     Allergies   Aleve [naproxen sodium]   Review of Systems Review of Systems Per HPI  Physical Exam Triage Vital Signs ED Triage Vitals  Enc Vitals Group     BP 03/22/22 1954 132/86     Pulse Rate 03/22/22 1954 84     Resp 03/22/22 1954 16     Temp 03/22/22 1954 98.1 F (36.7 C)     Temp Source 03/22/22 1954 Oral     SpO2 03/22/22 1954 95 %     Weight --      Height --      Head Circumference --      Peak Flow --      Pain Score 03/22/22 1952 6      Pain Loc --      Pain Edu? --      Excl. in GC? --    No data found.  Updated Vital Signs BP 132/86 (BP Location: Right Arm)   Pulse 84   Temp 98.1 F (36.7 C) (Oral)   Resp 16   LMP  (LMP Unknown)   SpO2 95%   Visual Acuity Right Eye Distance:   Left Eye Distance:   Bilateral Distance:    Right Eye Near:   Left Eye Near:    Bilateral Near:     Physical Exam Vitals and nursing note reviewed.  Constitutional:      Appearance: She is not ill-appearing or toxic-appearing.  HENT:     Head: Normocephalic and atraumatic.     Right Ear: Hearing and external ear normal.     Left Ear: Hearing and external ear normal.     Nose: Nose normal.     Mouth/Throat:     Lips: Pink.  Eyes:     General: Lids are normal. Vision grossly intact. Gaze aligned appropriately.     Extraocular Movements: Extraocular movements intact.     Conjunctiva/sclera: Conjunctivae normal.  Cardiovascular:     Rate and Rhythm: Normal rate and regular rhythm.     Heart sounds: Normal heart sounds, S1 normal and S2 normal.  Pulmonary:     Effort: Pulmonary effort is normal. No respiratory distress.     Breath sounds: Normal breath sounds and air entry.  Musculoskeletal:     Left shoulder: No swelling, deformity, tenderness, bony tenderness or crepitus. Decreased range of motion. Normal strength. Normal pulse.     Left upper arm: Tenderness present. No bony tenderness.     Left elbow: Normal range of motion.     Left forearm: Normal.     Left wrist: Normal.     Right hand: Normal.     Left hand: Normal.     Cervical back: Neck supple.     Comments: Left upper extremity: Tender to palpation of the distal left trapezius muscle with normal range of motion to the cervical spine without tenderness to the spinous processes of the C-spine.  Slightly decreased range of motion to the left shoulder due to tenderness.  Strength and sensation intact to bilateral upper extremities.  +2 radial pulses bilaterally.   Capillary refill is less than 3 bilaterally.  No obvious deformities, signs of injury, swelling, ecchymosis, or skin changes to the left upper extremity.  Muscular tenderness to palpation of the left bicep.  Negative Phalen's and  Tinnels signs bilaterally.   Skin:    General: Skin is warm and dry.     Capillary Refill: Capillary refill takes less than 2 seconds.     Findings: No rash.  Neurological:     General: No focal deficit present.     Mental Status: She is alert and oriented to person, place, and time. Mental status is at baseline.     Cranial Nerves: No dysarthria or facial asymmetry.  Psychiatric:        Mood and Affect: Mood normal.        Speech: Speech normal.        Behavior: Behavior normal.        Thought Content: Thought content normal.        Judgment: Judgment normal.      UC Treatments / Results  Labs (all labs ordered are listed, but only abnormal results are displayed) Labs Reviewed - No data to display  EKG   Radiology No results found.  Procedures Procedures (including critical care time)  Medications Ordered in UC Medications  methylPREDNISolone sodium succinate (SOLU-MEDROL) 125 mg/2 mL injection 80 mg (80 mg Intramuscular Given 03/22/22 2013)  acetaminophen (TYLENOL) tablet 975 mg (975 mg Oral Given 03/22/22 2013)    Initial Impression / Assessment and Plan / UC Course  I have reviewed the triage vital signs and the nursing notes.  Pertinent labs & imaging results that were available during my care of the patient were reviewed by me and considered in my medical decision making (see chart for details).   1.  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  Deferred imaging based on stable musculoskeletal exam and hemodynamically stable vital signs in clinic.  She is nontoxic-appearing.  Advised patient to stop taking  methylprednisolone intermittently and we will give a steroid burst with prednisone 40 mg once daily for the next 5 days to be taken with food.  No NSAIDs while taking prednisone.  Solu-Medrol 80 mg injection given in clinic for acute discomfort and inflammation relief.  She may take cyclobenzaprine every 12 hours as needed for muscle spasm, drowsiness precautions regarding cyclobenzaprine discussed.  Walking referral to orthopedics for follow-up given should symptoms fail to improve in the next 5 to 7 days using appropriate dose of steroid medication. She voices agreement with plan, work note given.  Discussed physical exam and available lab work findings in clinic with patient.  Counseled patient regarding appropriate use of medications and potential side effects for all medications recommended or prescribed today. Discussed red flag signs and symptoms of worsening condition,when to call the PCP office, return to urgent care, and when to seek higher level of care in the emergency department. Patient verbalizes understanding and agreement with plan. All questions answered. Patient discharged in stable condition.    Final Clinical Impressions(s) / UC Diagnoses   Final diagnoses:  Left arm pain  Muscle strain     Discharge Instructions      You have been evaluated in the today for your left arm pain. Your pain is most likely muscle strain which will improve on its own with time.   Take prednisone with food 40mg  per day starting tomorrow.  We gave you an injection of steroid in the clinic to help your pain and some tylenol.  You may also take flexeril muscle relaxer every 12 hours for muscle spasm.  Do not take this medication and drive or drink alcohol as it can make you sleepy.  Mainly use this medicine at nighttime as needed.  Apply heat and perform gentle range of motion exercises to the area of greatest pain to prevent muscle stiffness and provide further pain relief.   Call the  orthopedic provider listed on your paper work Delbert Harness) to schedule an appointment for follow-up evaluation.  If you develop any new or worsening symptoms, please return to urgent care.  If your symptoms are severe, please go to the emergency room.  I hope you feel better!     ED Prescriptions     Medication Sig Dispense Auth. Provider   cyclobenzaprine (FLEXERIL) 10 MG tablet Take 1 tablet (10 mg total) by mouth 2 (two) times daily as needed for muscle spasms. 20 tablet Reita May M, FNP   predniSONE (DELTASONE) 20 MG tablet Take 2 tablets (40 mg total) by mouth daily for 5 doses. 10 tablet Carlisle Beers, FNP      PDMP not reviewed this encounter.   Carlisle Beers, FNP 03/24/22 1024

## 2022-06-12 ENCOUNTER — Encounter (HOSPITAL_COMMUNITY): Payer: Self-pay | Admitting: Oral Surgery

## 2022-06-12 NOTE — H&P (Signed)
Date: February 14, 2022   Patient: Colleen Mills  PID: [id number]  DOB: [birth year]  SEX: Female   Patient referred by DDS for extraction tooth #14  CC: On and off pain  Past Medical History:  Obese, Dental Phobia   Medications: None    Allergies:     Naproxen, Aleve    Surgeries:   Oral Surgery         Social History       Smoking:  n          Alcohol:n Drug use:n                             Exam:  BMI 38. Large caries tooth # 14. No purulence, edema, fluctuance, trismus. Oral cancer screening negative. Pharynx clear. No lymphadenopathy.  Panorex: Large caries tooth #14.  Assessment:  Non-restorable tooth #  14.           Plan: Extraction Tooth # 14.  MAC v GA.                Rx: none              Risks and complications explained. Questions answered.   Gae Bon, DMD

## 2022-06-13 ENCOUNTER — Encounter (HOSPITAL_COMMUNITY): Payer: Self-pay | Admitting: Oral Surgery

## 2022-06-13 ENCOUNTER — Other Ambulatory Visit: Payer: Self-pay

## 2022-06-13 NOTE — Progress Notes (Signed)
Ms Colleen Mills denies chest pain or shortness of breath.  Patient denies having any s/s of Covid in her household, also denies any known exposure to Covid.   [name] states that her PCP is Dr, Janeice Robinson.

## 2022-06-13 NOTE — Anesthesia Preprocedure Evaluation (Signed)
Anesthesia Evaluation  Patient identified by MRN, date of birth, ID band Patient awake    Reviewed: Allergy & Precautions, NPO status , Patient's Chart, lab work & pertinent test results  Airway Mallampati: II  TM Distance: >3 FB Neck ROM: Full    Dental  (+) Dental Advisory Given, Teeth Intact   Pulmonary former smoker   Pulmonary exam normal breath sounds clear to auscultation       Cardiovascular negative cardio ROS Normal cardiovascular exam Rhythm:Regular Rate:Normal     Neuro/Psych  PSYCHIATRIC DISORDERS Anxiety     negative neurological ROS     GI/Hepatic negative GI ROS, Neg liver ROS,,,  Endo/Other    Morbid obesity  Renal/GU negative Renal ROS     Musculoskeletal negative musculoskeletal ROS (+)    Abdominal  (+) + obese  Peds  Hematology negative hematology ROS (+)   Anesthesia Other Findings   Reproductive/Obstetrics                              Anesthesia Physical Anesthesia Plan  ASA: 3  Anesthesia Plan: General   Post-op Pain Management: Tylenol PO (pre-op)* and Minimal or no pain anticipated   Induction: Intravenous  PONV Risk Score and Plan: 3 and Ondansetron, Dexamethasone, Midazolam and Treatment may vary due to age or medical condition  Airway Management Planned: Nasal ETT  Additional Equipment:   Intra-op Plan:   Post-operative Plan: Extubation in OR  Informed Consent: I have reviewed the patients History and Physical, chart, labs and discussed the procedure including the risks, benefits and alternatives for the proposed anesthesia with the patient or authorized representative who has indicated his/her understanding and acceptance.     Dental advisory given  Plan Discussed with: CRNA  Anesthesia Plan Comments:         Anesthesia Quick Evaluation

## 2022-06-14 ENCOUNTER — Ambulatory Visit (HOSPITAL_BASED_OUTPATIENT_CLINIC_OR_DEPARTMENT_OTHER): Payer: Medicaid Other | Admitting: Anesthesiology

## 2022-06-14 ENCOUNTER — Ambulatory Visit (HOSPITAL_COMMUNITY): Payer: Medicaid Other | Admitting: Anesthesiology

## 2022-06-14 ENCOUNTER — Encounter (HOSPITAL_COMMUNITY)
Admission: RE | Disposition: A | Discharge status: Home / Self Care | Payer: Self-pay | Source: Home / Self Care | Attending: Oral Surgery

## 2022-06-14 ENCOUNTER — Encounter (HOSPITAL_COMMUNITY): Payer: Self-pay | Admitting: Oral Surgery

## 2022-06-14 ENCOUNTER — Other Ambulatory Visit: Payer: Self-pay

## 2022-06-14 ENCOUNTER — Ambulatory Visit (HOSPITAL_COMMUNITY)
Admission: RE | Admit: 2022-06-14 | Discharge: 2022-06-14 | Disposition: A | Discharge status: Home / Self Care | Payer: Medicaid Other | Attending: Oral Surgery | Admitting: Oral Surgery

## 2022-06-14 DIAGNOSIS — Z6838 Body mass index (BMI) 38.0-38.9, adult: Secondary | ICD-10-CM | POA: Insufficient documentation

## 2022-06-14 DIAGNOSIS — K029 Dental caries, unspecified: Secondary | ICD-10-CM | POA: Insufficient documentation

## 2022-06-14 DIAGNOSIS — Z87891 Personal history of nicotine dependence: Secondary | ICD-10-CM | POA: Insufficient documentation

## 2022-06-14 DIAGNOSIS — F419 Anxiety disorder, unspecified: Secondary | ICD-10-CM

## 2022-06-14 HISTORY — DX: Other complications of anesthesia, initial encounter: T88.59XA

## 2022-06-14 HISTORY — DX: Fear of other medical care: F40.232

## 2022-06-14 HISTORY — PX: TOOTH EXTRACTION: SHX859

## 2022-06-14 SURGERY — DENTAL RESTORATION/EXTRACTIONS
Anesthesia: General | Site: Mouth

## 2022-06-14 MED ORDER — MIDAZOLAM HCL 2 MG/2ML IJ SOLN
INTRAMUSCULAR | Status: AC
  Filled 2022-06-14: qty 2

## 2022-06-14 MED ORDER — 0.9 % SODIUM CHLORIDE (POUR BTL) OPTIME
TOPICAL | Status: DC | PRN
  Administered 2022-06-14: 1000 mL

## 2022-06-14 MED ORDER — LIDOCAINE-EPINEPHRINE 1 %-1:100000 IJ SOLN
INTRAMUSCULAR | Status: DC | PRN
  Administered 2022-06-14: 7 mL

## 2022-06-14 MED ORDER — DEXAMETHASONE SODIUM PHOSPHATE 10 MG/ML IJ SOLN
INTRAMUSCULAR | Status: DC | PRN
  Administered 2022-06-14: 10 mg via INTRAVENOUS

## 2022-06-14 MED ORDER — LIDOCAINE 2% (20 MG/ML) 5 ML SYRINGE
INTRAMUSCULAR | Status: DC | PRN
  Administered 2022-06-14: 20 mg via INTRAVENOUS

## 2022-06-14 MED ORDER — MIDAZOLAM HCL 2 MG/2ML IJ SOLN
INTRAMUSCULAR | Status: DC | PRN
  Administered 2022-06-14: 2 mg via INTRAVENOUS

## 2022-06-14 MED ORDER — ACETAMINOPHEN 500 MG PO TABS
1000.0000 mg | ORAL_TABLET | Freq: Once | ORAL | Status: AC
  Administered 2022-06-14: 1000 mg via ORAL
  Filled 2022-06-14: qty 2

## 2022-06-14 MED ORDER — SUGAMMADEX SODIUM 200 MG/2ML IV SOLN
INTRAVENOUS | Status: DC | PRN
  Administered 2022-06-14: 300 mg via INTRAVENOUS

## 2022-06-14 MED ORDER — MEPERIDINE HCL 25 MG/ML IJ SOLN: 6.2500 mg | INTRAMUSCULAR | Status: DC | PRN

## 2022-06-14 MED ORDER — ORAL CARE MOUTH RINSE: 15.0000 mL | Freq: Once | OROMUCOSAL | Status: AC

## 2022-06-14 MED ORDER — DEXAMETHASONE SODIUM PHOSPHATE 10 MG/ML IJ SOLN
INTRAMUSCULAR | Status: AC
  Filled 2022-06-14: qty 1

## 2022-06-14 MED ORDER — AMISULPRIDE (ANTIEMETIC) 5 MG/2ML IV SOLN: 10.0000 mg | Freq: Once | INTRAVENOUS | Status: DC | PRN

## 2022-06-14 MED ORDER — ROCURONIUM BROMIDE 10 MG/ML (PF) SYRINGE
PREFILLED_SYRINGE | INTRAVENOUS | Status: AC
  Filled 2022-06-14: qty 10

## 2022-06-14 MED ORDER — ONDANSETRON HCL 4 MG/2ML IJ SOLN
INTRAMUSCULAR | Status: AC
  Filled 2022-06-14: qty 2

## 2022-06-14 MED ORDER — PROMETHAZINE HCL 25 MG/ML IJ SOLN: 6.2500 mg | INTRAMUSCULAR | Status: DC | PRN

## 2022-06-14 MED ORDER — FENTANYL CITRATE (PF) 100 MCG/2ML IJ SOLN: 25.0000 ug | INTRAMUSCULAR | Status: DC | PRN

## 2022-06-14 MED ORDER — ROCURONIUM BROMIDE 10 MG/ML (PF) SYRINGE
PREFILLED_SYRINGE | INTRAVENOUS | Status: DC | PRN
  Administered 2022-06-14: 40 mg via INTRAVENOUS

## 2022-06-14 MED ORDER — ONDANSETRON HCL 4 MG/2ML IJ SOLN
INTRAMUSCULAR | Status: DC | PRN
  Administered 2022-06-14: 4 mg via INTRAVENOUS

## 2022-06-14 MED ORDER — CHLORHEXIDINE GLUCONATE 0.12 % MT SOLN: 15.0000 mL | Freq: Once | OROMUCOSAL | Status: AC

## 2022-06-14 MED ORDER — FENTANYL CITRATE (PF) 250 MCG/5ML IJ SOLN
INTRAMUSCULAR | Status: AC
  Filled 2022-06-14: qty 5

## 2022-06-14 MED ORDER — CHLORHEXIDINE GLUCONATE 0.12 % MT SOLN
OROMUCOSAL | Status: AC
  Administered 2022-06-14: 15 mL via OROMUCOSAL
  Filled 2022-06-14: qty 15

## 2022-06-14 MED ORDER — FENTANYL CITRATE (PF) 250 MCG/5ML IJ SOLN
INTRAMUSCULAR | Status: DC | PRN
  Administered 2022-06-14: 100 ug via INTRAVENOUS

## 2022-06-14 MED ORDER — CEFAZOLIN IN SODIUM CHLORIDE 3-0.9 GM/100ML-% IV SOLN
3.0000 g | INTRAVENOUS | Status: AC
  Administered 2022-06-14: 3 g via INTRAVENOUS
  Filled 2022-06-14: qty 100

## 2022-06-14 MED ORDER — HYDROCODONE-ACETAMINOPHEN 5-325 MG PO TABS: 1.0000 | ORAL_TABLET | Freq: Four times a day (QID) | ORAL | 0 refills | Status: DC | PRN

## 2022-06-14 MED ORDER — LACTATED RINGERS IV SOLN: INTRAVENOUS | Status: DC

## 2022-06-14 MED ORDER — PROPOFOL 10 MG/ML IV BOLUS
INTRAVENOUS | Status: DC | PRN
  Administered 2022-06-14: 200 mg via INTRAVENOUS

## 2022-06-14 SURGICAL SUPPLY — 35 items
BAG COUNTER SPONGE SURGICOUNT (BAG) IMPLANT
BLADE SURG 15 STRL LF DISP TIS (BLADE) ×1 IMPLANT
BLADE SURG 15 STRL SS (BLADE) ×1
BUR CROSS CUT FISSURE 1.6 (BURR) ×1 IMPLANT
BUR EGG ELITE 4.0 (BURR) ×1 IMPLANT
CANISTER SUCT 3000ML PPV (MISCELLANEOUS) ×1 IMPLANT
COVER SURGICAL LIGHT HANDLE (MISCELLANEOUS) ×1 IMPLANT
GAUZE PACKING FOLDED 2  STR (GAUZE/BANDAGES/DRESSINGS) ×1
GAUZE PACKING FOLDED 2 STR (GAUZE/BANDAGES/DRESSINGS) ×1 IMPLANT
GLOVE BIO SURGEON STRL SZ 6.5 (GLOVE) IMPLANT
GLOVE BIO SURGEON STRL SZ7 (GLOVE) IMPLANT
GLOVE BIO SURGEON STRL SZ8 (GLOVE) ×1 IMPLANT
GLOVE BIOGEL PI IND STRL 6.5 (GLOVE) IMPLANT
GLOVE BIOGEL PI IND STRL 7.0 (GLOVE) IMPLANT
GOWN STRL REUS W/ TWL LRG LVL3 (GOWN DISPOSABLE) ×1 IMPLANT
GOWN STRL REUS W/ TWL XL LVL3 (GOWN DISPOSABLE) ×1 IMPLANT
GOWN STRL REUS W/TWL LRG LVL3 (GOWN DISPOSABLE) ×1
GOWN STRL REUS W/TWL XL LVL3 (GOWN DISPOSABLE) ×1
IV NS 1000ML (IV SOLUTION)
IV NS 1000ML BAXH (IV SOLUTION) ×1 IMPLANT
KIT BASIN OR (CUSTOM PROCEDURE TRAY) ×1 IMPLANT
KIT TURNOVER KIT B (KITS) ×1 IMPLANT
NDL HYPO 25GX1X1/2 BEV (NEEDLE) ×2 IMPLANT
NEEDLE HYPO 25GX1X1/2 BEV (NEEDLE) ×1 IMPLANT
NS IRRIG 1000ML POUR BTL (IV SOLUTION) ×1 IMPLANT
PAD ARMBOARD 7.5X6 YLW CONV (MISCELLANEOUS) ×1 IMPLANT
SLEEVE IRRIGATION ELITE 7 (MISCELLANEOUS) ×1 IMPLANT
SPIKE FLUID TRANSFER (MISCELLANEOUS) ×1 IMPLANT
SPONGE SURGIFOAM ABS GEL 12-7 (HEMOSTASIS) IMPLANT
SUT CHROMIC 3 0 PS 2 (SUTURE) ×1 IMPLANT
SYR BULB IRRIG 60ML STRL (SYRINGE) ×1 IMPLANT
SYR CONTROL 10ML LL (SYRINGE) ×1 IMPLANT
TRAY ENT MC OR (CUSTOM PROCEDURE TRAY) ×1 IMPLANT
TUBING IRRIGATION (MISCELLANEOUS) ×1 IMPLANT
YANKAUER SUCT BULB TIP NO VENT (SUCTIONS) ×1 IMPLANT

## 2022-06-14 NOTE — H&P (Signed)
H&P documentation  -History and Physical Reviewed  -Patient has been re-examined  -No change in the plan of care  Colleen Mills  

## 2022-06-14 NOTE — Transfer of Care (Signed)
Immediate Anesthesia Transfer of Care Note  Patient: Colleen Mills  Procedure(s) Performed: DENTAL EXTRACTION TOOTH #14 (Mouth)  Patient Location: PACU  Anesthesia Type:General  Level of Consciousness: awake and patient cooperative  Airway & Oxygen Therapy: Patient Spontanous Breathing and Patient connected to face mask oxygen  Post-op Assessment: Report given to RN, Post -op Vital signs reviewed and stable, and Patient moving all extremities  Post vital signs: Reviewed and stable  Last Vitals:  Vitals Value Taken Time  BP 148/84 06/14/22 0938  Temp    Pulse 100 06/14/22 0938  Resp 15 06/14/22 0938  SpO2 95 % 06/14/22 0938  Vitals shown include unvalidated device data.  Last Pain:  Vitals:   06/14/22 0716  TempSrc:   PainSc: 0-No pain         Complications: No notable events documented.

## 2022-06-14 NOTE — Anesthesia Procedure Notes (Signed)
Procedure Name: Intubation Date/Time: 06/14/2022 9:10 AM  Performed by: Lavell Luster, CRNAPre-anesthesia Checklist: Patient identified, Emergency Drugs available, Suction available and Patient being monitored Patient Re-evaluated:Patient Re-evaluated prior to induction Oxygen Delivery Method: Circle System Utilized Preoxygenation: Pre-oxygenation with 100% oxygen Induction Type: IV induction Ventilation: Mask ventilation without difficulty Laryngoscope Size: Mac and 3 Grade View: Grade I Tube type: Oral Tube size: 7.0 mm Number of attempts: 1 Airway Equipment and Method: Stylet and Oral airway Placement Confirmation: ETT inserted through vocal cords under direct vision, positive ETCO2 and breath sounds checked- equal and bilateral Secured at: 21 cm Tube secured with: Tape Dental Injury: Teeth and Oropharynx as per pre-operative assessment

## 2022-06-14 NOTE — Op Note (Signed)
06/14/2022  9:23 AM  PATIENT:  Colleen Mills  57 y.o. female  PRE-OPERATIVE DIAGNOSIS:  NON-RESTORABLE TOOTH #14 SECONDARY TO DENTAL CARIES, MORBID OBESITY  POST-OPERATIVE DIAGNOSIS:  SAME  PROCEDURE:  Procedure(s): DENTAL EXTRACTION TOOTH #14  SURGEON:  Surgeon(s): Diona Browner, DMD  ANESTHESIA:   local and general  EBL:  minimal  DRAINS: none   SPECIMEN:  No Specimen  COUNTS:  YES  PLAN OF CARE: Discharge to home after PACU  PATIENT DISPOSITION:  PACU - hemodynamically stable.   PROCEDURE DETAILS: Dictation # [id number]  Gae Bon, DMD 06/14/2022 9:23 AM

## 2022-06-14 NOTE — Op Note (Signed)
NAME: Colleen Mills, Colleen Mills MEDICAL RECORD NO: [id number] ACCOUNT NO: [id number] DATE OF BIRTH: 11/01/1965 FACILITY: [facility] LOCATION: [facility] PHYSICIAN: Gae Bon, DDS  Operative Report   DATE OF PROCEDURE: 06/14/2022  PREOPERATIVE DIAGNOSIS:  Nonrestorable tooth #14 secondary to dental caries, morbid obesity.  POSTOPERATIVE DIAGNOSIS:  Nonrestorable tooth #14 secondary to dental caries, morbid obesity.  PROCEDURE:  Extraction tooth #14.  SURGEON:  Gae Bon, DDS  ANESTHESIA:  General oral intubation, Dr. Lissa Hoard, attending.  DESCRIPTION OF PROCEDURE:  The patient was taken to the operating room and placed on the table in supine position.  General anesthesia was administered and an oral endotracheal tube was placed and secured.  The throat pack was placed.  Local anesthesia  was administered buccally and palatally around tooth #14.  A 15 blade was used to make a sulcular incision around this tooth.  The periosteum was reflected.  The tooth was elevated, but could not be removed with the dental forceps, so this tooth was  sectioned with a Stryker handpiece and fissure bur under irrigation, the roots were sectioned and removed individually with the rongeur and the upper forceps.  Then, the socket was curetted, irrigated.  No suture was needed for closure.  The oral cavity  was then irrigated and suctioned.  The throat pack was removed.  The patient was left under care of anesthesia for extubation and transport to recovery room with plans for discharge home through day surgery.  ESTIMATED BLOOD LOSS:  Minimum.  COMPLICATIONS:  None.  SPECIMENS:  None.   PUS D: 06/14/2022 9:25:31 am T: 06/14/2022 9:56:00 am  JOB: [id number]/ [id number]

## 2022-06-15 ENCOUNTER — Encounter (HOSPITAL_COMMUNITY): Payer: Self-pay | Admitting: Oral Surgery

## 2022-06-15 NOTE — Anesthesia Postprocedure Evaluation (Signed)
Anesthesia Post Note  Patient: Colleen Mills  Procedure(s) Performed: DENTAL EXTRACTION TOOTH #14 (Mouth)     Patient location during evaluation: PACU Anesthesia Type: General Level of consciousness: sedated and patient cooperative Pain management: pain level controlled Vital Signs Assessment: post-procedure vital signs reviewed and stable Respiratory status: spontaneous breathing Cardiovascular status: stable Anesthetic complications: no   No notable events documented.  Last Vitals:  Vitals:   06/14/22 0950 06/14/22 1005  BP: (!) 141/85 (!) 136/91  Pulse: 88 79  Resp: 14 10  Temp:  (!) 36.4 C  SpO2: 93% 96%    Last Pain:  Vitals:   06/14/22 1005  TempSrc:   PainSc: 0-No pain                 Nolon Nations

## 2023-02-22 ENCOUNTER — Ambulatory Visit
Admission: RE | Admit: 2023-02-22 | Discharge: 2023-02-22 | Disposition: A | Discharge status: Home / Self Care | Payer: 59 | Source: Ambulatory Visit | Attending: Internal Medicine | Admitting: Internal Medicine

## 2023-02-22 ENCOUNTER — Other Ambulatory Visit: Payer: Self-pay | Admitting: Internal Medicine

## 2023-02-22 DIAGNOSIS — M79632 Pain in left forearm: Secondary | ICD-10-CM

## 2023-08-29 ENCOUNTER — Ambulatory Visit: Admitting: Podiatry

## 2023-09-05 ENCOUNTER — Ambulatory Visit: Admitting: Podiatry

## 2023-09-05 DIAGNOSIS — M722 Plantar fascial fibromatosis: Secondary | ICD-10-CM

## 2023-09-05 NOTE — Progress Notes (Signed)
  Subjective:  Patient ID: Colleen Mills, female    DOB: 08/19/1965,  MRN: [id number]  Chief Complaint  Patient presents with   Foot Pain    Bilateral heel pain    58 y.o. female presents with the above complaint.  Patient presents for bilateral heel pain that has been going for quite some time is progressive gotten worse worse with ambulation with pressure of harvest with taking for step in the morning pain scale 7 out of 10 dull ache in nature would like to discuss treatment options for it.    Review of Systems: Negative except as noted in the HPI. Denies N/V/F/Ch.  Past Medical History:  Diagnosis Date   Complication of anesthesia    COVID-19 virus infection 03/2020   Morbid obesity (HCC)    Phobia of dental procedure     Current Outpatient Medications:    Aspirin-Acetaminophen-Caffeine (GOODY HEADACHE PO), Take by mouth., Disp: , Rfl:    HYDROcodone-acetaminophen (NORCO) 5-325 MG tablet, Take 1 tablet by mouth every 6 (six) hours as needed for moderate pain., Disp: 6 tablet, Rfl: 0   meloxicam (MOBIC) 7.5 MG tablet, Take 7.5 mg by mouth daily., Disp: , Rfl:   Social History   Tobacco Use  Smoking Status Former   Current packs/day: 0.00   Types: Cigarettes   Quit date: 05/22/2014   Years since quitting: 9.2  Smokeless Tobacco Never    Allergies  Allergen Reactions   Aleve [Naproxen Sodium] Nausea And Vomiting    Patient states medication makes her sick, "Nausea and throwing up"   Objective:  There were no vitals filed for this visit. There is no height or weight on file to calculate BMI. Constitutional Well developed. Well nourished.  Vascular Dorsalis pedis pulses palpable bilaterally. Posterior tibial pulses palpable bilaterally. Capillary refill normal to all digits.  No cyanosis or clubbing noted. Pedal hair growth normal.  Neurologic Normal speech. Oriented to person, place, and time. Epicritic sensation to light touch grossly present bilaterally.   Dermatologic Nails well groomed and normal in appearance. No open wounds. No skin lesions.  Orthopedic: Normal joint ROM without pain or crepitus bilaterally. No visible deformities. Tender to palpation at the calcaneal tuber bilaterally. No pain with calcaneal squeeze bilaterally. Ankle ROM diminished range of motion bilaterally. Silfverskiold Test: positive bilaterally.   Radiographs: None  Assessment:   1. Plantar fasciitis of right foot   2. Plantar fasciitis of left foot    Plan:  Patient was evaluated and treated and all questions answered.  Plantar Fasciitis, bilaterally - XR reviewed as above.  - Educated on icing and stretching. Instructions given.  - Injection delivered to the plantar fascia as below. - DME: Plantar fascial brace dispensed to support the medial longitudinal arch of the foot and offload pressure from the heel and prevent arch collapse during weightbearing - Pharmacologic management: None  Procedure: Injection Tendon/Ligament Location: Bilateral plantar fascia at the glabrous junction; medial approach. Skin Prep: alcohol Injectate: 0.5 cc 0.5% marcaine plain, 0.5 cc of 1% Lidocaine, 0.5 cc kenalog 10. Disposition: Patient tolerated procedure well. Injection site dressed with a band-aid.  No follow-ups on file.

## 2023-10-03 ENCOUNTER — Ambulatory Visit (INDEPENDENT_AMBULATORY_CARE_PROVIDER_SITE_OTHER): Admitting: Podiatry

## 2023-10-03 DIAGNOSIS — M722 Plantar fascial fibromatosis: Secondary | ICD-10-CM | POA: Diagnosis not present

## 2023-10-03 DIAGNOSIS — M62462 Contracture of muscle, left lower leg: Secondary | ICD-10-CM

## 2023-10-03 DIAGNOSIS — M62461 Contracture of muscle, right lower leg: Secondary | ICD-10-CM | POA: Diagnosis not present

## 2023-10-03 NOTE — Progress Notes (Signed)
  Subjective:  Patient ID: Colleen Mills, female    DOB: 1965-08-17,  MRN: [id number]  Chief Complaint  Patient presents with   Plantar Fasciitis    Pt stated that her heels are doing better she stated that the braces do help and the injection helped     58 y.o. female presents with the above complaint.  Patient presents for follow-up of bilateral plantar fasciitis.  She states she is doing a lot better.  Injection helped.  She still has some residual pain denies any other acute complaints  Review of Systems: Negative except as noted in the HPI. Denies N/V/F/Ch.  Past Medical History:  Diagnosis Date   Complication of anesthesia    COVID-19 virus infection 03/2020   Morbid obesity (HCC)    Phobia of dental procedure     Current Outpatient Medications:    Aspirin-Acetaminophen -Caffeine (GOODY HEADACHE PO), Take by mouth., Disp: , Rfl:    HYDROcodone -acetaminophen  (NORCO) 5-325 MG tablet, Take 1 tablet by mouth every 6 (six) hours as needed for moderate pain., Disp: 6 tablet, Rfl: 0   meloxicam  (MOBIC ) 7.5 MG tablet, Take 7.5 mg by mouth daily., Disp: , Rfl:   Social History   Tobacco Use  Smoking Status Former   Current packs/day: 0.00   Types: Cigarettes   Quit date: 05/22/2014   Years since quitting: 9.3  Smokeless Tobacco Never    Allergies  Allergen Reactions   Aleve  [Naproxen  Sodium] Nausea And Vomiting    Patient states medication makes her sick, "Nausea and throwing up"   Objective:  There were no vitals filed for this visit. There is no height or weight on file to calculate BMI. Constitutional Well developed. Well nourished.  Vascular Dorsalis pedis pulses palpable bilaterally. Posterior tibial pulses palpable bilaterally. Capillary refill normal to all digits.  No cyanosis or clubbing noted. Pedal hair growth normal.  Neurologic Normal speech. Oriented to person, place, and time. Epicritic sensation to light touch grossly present bilaterally.  Dermatologic  Nails well groomed and normal in appearance. No open wounds. No skin lesions.  Orthopedic: Normal joint ROM without pain or crepitus bilaterally. No visible deformities. Tender to palpation at the calcaneal tuber bilaterally. No pain with calcaneal squeeze bilaterally. Ankle ROM diminished range of motion bilaterally. Silfverskiold Test: positive bilaterally.   Radiographs: None  Assessment:   No diagnosis found.  Plan:  Patient was evaluated and treated and all questions answered.  Plantar Fasciitis, bilaterally with underlying gastrocnemius equinus - XR reviewed as above.  - Educated on icing and stretching. Instructions given.  - Second injection delivered to the plantar fascia as below. - DME: Plantar fascial brace dispensed to support the medial longitudinal arch of the foot and offload pressure from the heel and prevent arch collapse during weightbearing - Pharmacologic management: None  Procedure: Injection Tendon/Ligament Location: Bilateral plantar fascia at the glabrous junction; medial approach. Skin Prep: alcohol Injectate: 0.5 cc 0.5% marcaine plain, 0.5 cc of 1% Lidocaine , 0.5 cc kenalog  10. Disposition: Patient tolerated procedure well. Injection site dressed with a band-aid.  No follow-ups on file.

## 2023-12-30 ENCOUNTER — Emergency Department (HOSPITAL_COMMUNITY)
Admission: EM | Admit: 2023-12-30 | Discharge: 2023-12-30 | Disposition: A | Discharge status: Home / Self Care | Attending: Emergency Medicine | Admitting: Emergency Medicine

## 2023-12-30 ENCOUNTER — Emergency Department (HOSPITAL_COMMUNITY)

## 2023-12-30 ENCOUNTER — Other Ambulatory Visit: Payer: Self-pay

## 2023-12-30 ENCOUNTER — Encounter (HOSPITAL_COMMUNITY): Payer: Self-pay

## 2023-12-30 DIAGNOSIS — Z7982 Long term (current) use of aspirin: Secondary | ICD-10-CM | POA: Diagnosis not present

## 2023-12-30 DIAGNOSIS — X509XXA Other and unspecified overexertion or strenuous movements or postures, initial encounter: Secondary | ICD-10-CM | POA: Insufficient documentation

## 2023-12-30 DIAGNOSIS — M25511 Pain in right shoulder: Secondary | ICD-10-CM | POA: Insufficient documentation

## 2023-12-30 MED ORDER — LIDOCAINE 5 % EX PTCH: 1.0000 | MEDICATED_PATCH | CUTANEOUS | 0 refills | Status: AC

## 2023-12-30 MED ORDER — OXYCODONE-ACETAMINOPHEN 5-325 MG PO TABS
1.0000 | ORAL_TABLET | ORAL | Status: DC | PRN
  Administered 2023-12-30: 1 via ORAL

## 2023-12-30 MED ORDER — METHOCARBAMOL 500 MG PO TABS: 500.0000 mg | ORAL_TABLET | Freq: Two times a day (BID) | ORAL | 0 refills | Status: AC

## 2023-12-30 MED ORDER — OXYCODONE-ACETAMINOPHEN 5-325 MG PO TABS
ORAL_TABLET | ORAL | Status: AC
  Filled 2023-12-30: qty 1

## 2023-12-30 NOTE — ED Notes (Signed)
 Shoulder immobilizer applied in triage for comfort

## 2023-12-30 NOTE — Discharge Instructions (Addendum)
 Muscle relaxer has been sent into your pharmacy.  This will make you drowsy.  Be careful when you take this and do not do anything dangerous.  Follow-up with your primary care doctor.  If this does not improve and you need an orthopedic follow-up I have also listed an orthopedist for you.  Lidocaine  patch sent in for you as well.  Return for any emergent symptoms. Do not use the sling unless you absolutely need to.  It is best if you continue to range your shoulder to keep the mobility.

## 2023-12-30 NOTE — ED Triage Notes (Signed)
 Pt c.o right shoulder pain after helping her friend move a lawnmower yesterday. Unable to move her shoulder without severe pain.

## 2023-12-30 NOTE — ED Provider Notes (Signed)
 [place] [facility] AT [facility] Provider Note   CSN: [id number] Arrival date & time: 12/30/23  1752     Patient presents with: Shoulder Pain   Colleen Mills is a 58 y.o. female.   58 year old female presents today for concern of right shoulder pain.  This started yesterday.  She states she helped move a riding lawnmower as well as moved her stove because the alarm was going off and she needed to turn it off.  Denies any other injury.  States it has been significantly uncomfortable.  Has not taken anything prior to arrival.  The history is provided by the patient. No language interpreter was used.       Prior to Admission medications   Medication Sig Start Date End Date Taking? Authorizing Provider  lidocaine  (LIDODERM ) 5 % Place 1 patch onto the skin daily. Remove & Discard patch within 12 hours or as directed by MD 12/30/23  Yes Hildegard, Willeen Novak, PA-C  methocarbamol  (ROBAXIN ) 500 MG tablet Take 1 tablet (500 mg total) by mouth 2 (two) times daily. 12/30/23  Yes Odessia Asleson, PA-C  Aspirin-Acetaminophen -Caffeine (GOODY HEADACHE PO) Take by mouth.    [provider]  HYDROcodone -acetaminophen  (NORCO) 5-325 MG tablet Take 1 tablet by mouth every 6 (six) hours as needed for moderate pain. 06/14/22   Sheryle Hamilton, DMD  meloxicam  (MOBIC ) 7.5 MG tablet Take 7.5 mg by mouth daily.    [provider]    Allergies: Aleve  [naproxen  sodium]    Review of Systems  Constitutional:  Negative for fever.  Musculoskeletal:  Positive for arthralgias. Negative for joint swelling.  All other systems reviewed and are negative.   Updated Vital Signs BP 121/85   Pulse 92   Temp 98.1 F (36.7 C) (Oral)   Resp 20   LMP  (LMP Unknown)   SpO2 95%   Physical Exam Vitals and nursing note reviewed.  Constitutional:      General: She is not in acute distress.    Appearance: Normal appearance. She is not ill-appearing.  HENT:     Head: Normocephalic and  atraumatic.     Nose: Nose normal.  Eyes:     Conjunctiva/sclera: Conjunctivae normal.  Pulmonary:     Effort: Pulmonary effort is normal. No respiratory distress.  Musculoskeletal:        General: No deformity.     Comments: Limited range of motion in the right shoulder secondary to pain.  No visible deformity.  No warmth in the right shoulder joint.  Good range of motion and strength in the right elbow and right wrist.  All digits with good sensation.  Cervical spine without tenderness to palpation.   Skin:    Findings: No rash.  Neurological:     Mental Status: She is alert.     (all labs ordered are listed, but only abnormal results are displayed) Labs Reviewed - No data to display  EKG: None  Radiology: DG Shoulder Right Result Date: 12/30/2023 CLINICAL DATA:  Right shoulder pain. EXAM: RIGHT SHOULDER - 2+ VIEW COMPARISON:  None Available. FINDINGS: There is no evidence of acute fracture or dislocation. Mild degenerative changes are present at the acromioclavicular and glenohumeral joints. Calcifications are noted in the region of the greater tuberosity, which may be associated with calcific tendinosis. Soft tissues are unremarkable. IMPRESSION: 1. No acute fracture or dislocation. 2. Mild degenerative changes at the glenohumeral acromioclavicular joints. 3. Calcifications in the region of the greater tuberosity, may be associated  with calcific tendinosis. Electronically Signed   By: Leita Birmingham M.D.   On: 12/30/2023 18:31     Procedures   Medications Ordered in the ED  oxyCODONE -acetaminophen  (PERCOCET/ROXICET) 5-325 MG per tablet 1 tablet (1 tablet Oral Given 12/30/23 1803)                                    Medical Decision Making Amount and/or Complexity of Data Reviewed Radiology: ordered.  Risk Prescription drug management.   58 year old female presents today for concern of right shoulder pain.  This started yesterday.  She did move a riding lawnmower and  move her stove.  Likely muscle strain or muscle spasm.  Cervical spine without tenderness palpation.  Neurovascularly intact.  Advised her not to use her sling unless absolutely necessary from a pain standpoint.  Advised her of the importance of range of motion.  Discussed follow-up with PCP.  As needed referral given for orthopedist.  Final diagnoses:  Acute pain of right shoulder    ED Discharge Orders          Ordered    methocarbamol  (ROBAXIN ) 500 MG tablet  2 times daily        12/30/23 2007    lidocaine  (LIDODERM ) 5 %  Every 24 hours        12/30/23 2007               Hildegard Loge, DEVONNA 12/30/23 2011    Charlyn Sora, MD 12/30/23 2206

## 2023-12-31 ENCOUNTER — Ambulatory Visit (HOSPITAL_COMMUNITY)
Admission: EM | Admit: 2023-12-31 | Discharge: 2023-12-31 | Disposition: A | Discharge status: Home / Self Care | Attending: Emergency Medicine | Admitting: Emergency Medicine

## 2023-12-31 ENCOUNTER — Encounter (HOSPITAL_COMMUNITY): Payer: Self-pay

## 2023-12-31 DIAGNOSIS — M25511 Pain in right shoulder: Secondary | ICD-10-CM

## 2023-12-31 MED ORDER — KETOROLAC TROMETHAMINE 30 MG/ML IJ SOLN
30.0000 mg | Freq: Once | INTRAMUSCULAR | Status: AC
  Administered 2023-12-31 (×2): 30 mg via INTRAMUSCULAR

## 2023-12-31 MED ORDER — KETOROLAC TROMETHAMINE 30 MG/ML IJ SOLN
INTRAMUSCULAR | Status: AC
  Filled 2023-12-31: qty 1

## 2023-12-31 NOTE — ED Provider Notes (Signed)
 [facility]    CSN: [id number] Arrival date & time: 12/31/23  1314      History   Chief Complaint Chief Complaint  Patient presents with   Shoulder Pain    HPI ROSARY FILOSA is a 58 y.o. female.   Patient presents with continued right shoulder pain after being seen in the ER yesterday.  Patient states that she began to have right shoulder pain 3 days ago that radiates down her right arm.  Patient states that she occasionally has some numbness down into her right hand as well.  Patient states that she has been taking the Robaxin  and reports that this makes her drowsy and has not yet provided her any relief.  Denies chest pain, weakness, back pain, and any recent falls or injuries.  Patient denies taking any other medication for her pain.  The history is provided by the patient and medical records.  Shoulder Pain   Past Medical History:  Diagnosis Date   Complication of anesthesia    COVID-19 virus infection 03/2020   Morbid obesity (HCC)    Phobia of dental procedure     Patient Active Problem List   Diagnosis Date Noted   Morbid obesity (HCC)    COVID-19 virus infection 03/2020    Past Surgical History:  Procedure Laterality Date   HERNIA REPAIR     as a child   TOOTH EXTRACTION N/A 06/14/2022   Procedure: DENTAL EXTRACTION TOOTH #14;  Surgeon: Sheryle Hamilton, DMD;  Location: [facility] OR;  Service: Oral Surgery;  Laterality: N/A;    OB History   No obstetric history on file.      Home Medications    Prior to Admission medications   Medication Sig Start Date End Date Taking? Authorizing Provider  Aspirin-Acetaminophen -Caffeine (GOODY HEADACHE PO) Take by mouth.    [provider]  lidocaine  (LIDODERM ) 5 % Place 1 patch onto the skin daily. Remove & Discard patch within 12 hours or as directed by MD Patient not taking: Reported on 12/31/2023 12/30/23   Hildegard Loge, PA-C  methocarbamol  (ROBAXIN ) 500 MG tablet Take 1 tablet (500 mg total) by mouth 2  (two) times daily. 12/30/23   Hildegard Loge, PA-C    Family History Family History  Problem Relation Age of Onset   Hypertension Mother    Hypertension Other     Social History Social History   Tobacco Use   Smoking status: Former    Current packs/day: 0.00    Types: Cigarettes    Quit date: 05/22/2014    Years since quitting: 9.6   Smokeless tobacco: Never  Vaping Use   Vaping status: Never Used  Substance Use Topics   Alcohol use: Not Currently    Comment: occ   Drug use: No     Allergies   Aleve  [naproxen  sodium]   Review of Systems Review of Systems  Per HPI  Physical Exam Triage Vital Signs ED Triage Vitals  Encounter Vitals Group     BP 12/31/23 1605 137/89     Girls Systolic BP Percentile --      Girls Diastolic BP Percentile --      Boys Systolic BP Percentile --      Boys Diastolic BP Percentile --      Pulse Rate 12/31/23 1605 74     Resp 12/31/23 1605 16     Temp 12/31/23 1605 98.2 F (36.8 C)     Temp Source 12/31/23 1605 Oral  SpO2 12/31/23 1605 95 %     Weight --      Height --      Head Circumference --      Peak Flow --      Pain Score 12/31/23 1607 10     Pain Loc --      Pain Education --      Exclude from Growth Chart --    No data found.  Updated Vital Signs BP 137/89 (BP Location: Left Arm)   Pulse 74   Temp 98.2 F (36.8 C) (Oral)   Resp 16   LMP  (LMP Unknown)   SpO2 95%   Visual Acuity Right Eye Distance:   Left Eye Distance:   Bilateral Distance:    Right Eye Near:   Left Eye Near:    Bilateral Near:     Physical Exam Vitals and nursing note reviewed.  Constitutional:      General: She is awake. She is not in acute distress.    Appearance: Normal appearance. She is well-developed and well-groomed. She is not ill-appearing.  Musculoskeletal:     Right shoulder: Tenderness present. No swelling, deformity or effusion. Decreased range of motion.     Cervical back: Muscular tenderness present.     Comments:  Generalized right shoulder pain that extends into right sided cervical paraspinal musculature.  Decreased range of motion secondary to pain.  Skin:    General: Skin is warm and dry.  Neurological:     Mental Status: She is alert.  Psychiatric:        Behavior: Behavior is cooperative.      UC Treatments / Results  Labs (all labs ordered are listed, but only abnormal results are displayed) Labs Reviewed - No data to display  EKG   Radiology DG Shoulder Right Result Date: 12/30/2023 CLINICAL DATA:  Right shoulder pain. EXAM: RIGHT SHOULDER - 2+ VIEW COMPARISON:  None Available. FINDINGS: There is no evidence of acute fracture or dislocation. Mild degenerative changes are present at the acromioclavicular and glenohumeral joints. Calcifications are noted in the region of the greater tuberosity, which may be associated with calcific tendinosis. Soft tissues are unremarkable. IMPRESSION: 1. No acute fracture or dislocation. 2. Mild degenerative changes at the glenohumeral acromioclavicular joints. 3. Calcifications in the region of the greater tuberosity, may be associated with calcific tendinosis. Electronically Signed   By: Leita Birmingham M.D.   On: 12/30/2023 18:31    Procedures Procedures (including critical care time)  Medications Ordered in UC Medications  ketorolac  (TORADOL ) 30 MG/ML injection 30 mg (30 mg Intramuscular Given 12/31/23 1652)    Initial Impression / Assessment and Plan / UC Course  I have reviewed the triage vital signs and the nursing notes.  Pertinent labs & imaging results that were available during my care of the patient were reviewed by me and considered in my medical decision making (see chart for details).     Patient is overall well-appearing.  Vitals are stable.  Deferred imaging due to x-ray being performed during ER visit yesterday.  This x-ray revealed no acute fracture or dislocation.  It did reveal mild degenerative changes of the glenohumeral joints  as well as calcifications in the region of the greater tuberosity likely consistent with calcific tendinosis.  Given IM Toradol  in clinic.  Recommended continuing with Robaxin  and lidocaine  patches.  Recommended also taking Tylenol  and ibuprofen  as needed for pain.  Given orthopedic follow-up.  Discussed follow-up and return precautions. Final Clinical Impressions(s) /  UC Diagnoses   Final diagnoses:  Acute pain of right shoulder     Discharge Instructions      You are given an injection of Toradol  in clinic today to help with your pain.  Do not take any ibuprofen  for at least 8 hours after receiving this injection. You can continue taking the Robaxin  twice daily as needed for the pain.  You can also continue applying lidocaine  patches as well. Alternate between 650 mg of Tylenol  and 400 to 600 mg of ibuprofen  every 6-8 hours as needed for pain. Alternate between ice and heat and do some gentle stretching to avoid becoming stiff. Follow-up with [facility] for further evaluation management of your pain if it continues. Otherwise follow-up with your primary care provider or return here as needed.   ED Prescriptions   None    PDMP not reviewed this encounter.   Johnie Flaming A, NP 12/31/23 [phone]

## 2023-12-31 NOTE — ED Triage Notes (Signed)
 Patient here today with c/o right shoulder pain that radiates down right arm X 3 days. Patient states that she has some numbness in her right hand. Patient went to the ED yesterday with no relief. No known injury. Patient also has a headache today.

## 2023-12-31 NOTE — Discharge Instructions (Signed)
 You are given an injection of Toradol  in clinic today to help with your pain.  Do not take any ibuprofen  for at least 8 hours after receiving this injection. You can continue taking the Robaxin  twice daily as needed for the pain.  You can also continue applying lidocaine  patches as well. Alternate between 650 mg of Tylenol  and 400 to 600 mg of ibuprofen  every 6-8 hours as needed for pain. Alternate between ice and heat and do some gentle stretching to avoid becoming stiff. Follow-up with [facility] for further evaluation management of your pain if it continues. Otherwise follow-up with your primary care provider or return here as needed.

## 2024-02-28 ENCOUNTER — Ambulatory Visit: Admitting: Podiatry

## 2024-03-04 ENCOUNTER — Ambulatory Visit: Admitting: Podiatry

## 2024-03-04 DIAGNOSIS — M216X1 Other acquired deformities of right foot: Secondary | ICD-10-CM

## 2024-03-04 DIAGNOSIS — M216X2 Other acquired deformities of left foot: Secondary | ICD-10-CM | POA: Diagnosis not present

## 2024-03-04 DIAGNOSIS — M7661 Achilles tendinitis, right leg: Secondary | ICD-10-CM | POA: Diagnosis not present

## 2024-03-04 DIAGNOSIS — M7662 Achilles tendinitis, left leg: Secondary | ICD-10-CM | POA: Diagnosis not present

## 2024-03-04 NOTE — Progress Notes (Signed)
 Subjective:  Patient ID: Colleen Mills, female    DOB: [birth year],  MRN: [id number]  Chief Complaint  Patient presents with   Foot Pain    Has heel pain bilateral for a long time. She also mention her achilles tendon pain     58 y.o. female presents with the above complaint.  Patient presents with bilateral Achilles tendinitis insertional pain.  Patient states painful to touch she has gotten a lot worse.  She is on her feet a lot.  She does not wear any orthotics.  She would like to discuss treatment options for this pain scale 7 out of 10 dull aching nature.   Review of Systems: Negative except as noted in the HPI. Denies N/V/F/Ch.  Past Medical History:  Diagnosis Date   Complication of anesthesia    COVID-19 virus infection 03/2020   Morbid obesity (HCC)    Phobia of dental procedure     Current Outpatient Medications:    Aspirin-Acetaminophen -Caffeine (GOODY HEADACHE PO), Take by mouth., Disp: , Rfl:    lidocaine  (LIDODERM ) 5 %, Place 1 patch onto the skin daily. Remove & Discard patch within 12 hours or as directed by MD (Patient not taking: Reported on 12/31/2023), Disp: 14 patch, Rfl: 0   methocarbamol  (ROBAXIN ) 500 MG tablet, Take 1 tablet (500 mg total) by mouth 2 (two) times daily., Disp: 20 tablet, Rfl: 0  Social History   Tobacco Use  Smoking Status Former   Current packs/day: 0.00   Types: Cigarettes   Quit date: 05/22/2014   Years since quitting: 9.7  Smokeless Tobacco Never    Allergies  Allergen Reactions   Aleve  [Naproxen  Sodium] Nausea And Vomiting    Patient states medication makes her sick, Nausea and throwing up   Objective:  There were no vitals filed for this visit. There is no height or weight on file to calculate BMI. Constitutional Well developed. Well nourished.  Vascular Dorsalis pedis pulses palpable bilaterally. Posterior tibial pulses palpable bilaterally. Capillary refill normal to all digits.  No cyanosis or clubbing noted. Pedal  hair growth normal.  Neurologic Normal speech. Oriented to person, place, and time. Epicritic sensation to light touch grossly present bilaterally.  Dermatologic Nails well groomed and normal in appearance. No open wounds. No skin lesions.  Orthopedic: Patient presents with bilateral Achilles tendinitis insertional pain positive gastrocnemius equinus noted bilaterally.  Haglund deformity noted bilaterally.  No pain at the peroneal tendon plantar fascia posterior tibial tendon.   Radiographs: None  Assessment:   1. Right Achilles tendinitis   2. Left Achilles tendinitis    Plan:  Patient was evaluated and treated and all questions answered.  Pes planovalgus/foot deformity -I explained to patient the etiology of pes planovalgus and relationship with heel pain/arch pain and various treatment options were discussed.  Given patient foot structure in the setting of heel pain/arch pain I believe patient will benefit from custom-made orthotics to help control the hindfoot motion support the arch of the foot and take the stress away from arches.  Patient agrees with the plan like to proceed with orthotics -Patient was casted for orthotics with 1/4 inch heel lift  Bilateral Achilles tendinitis - All questions and concerns were discussed with the patient in extensive detail.  Given the amount of pain that she is experiencing patient will benefit from bilateral steroid injection and I encouraged her to decrease activity.  She is obese high risk of undergoing rupture if there is an injury she states understanding - Shoe  gear modification discussed -A steroid injection was performed at bilateral Kager's fat pad using 1% plain Lidocaine  and 10 mg of Kenalog . This was well tolerated.   No follow-ups on file.

## 2024-03-04 NOTE — Addendum Note (Signed)
 Addended by: Veron Senner on: 03/04/2024 01:36 PM   Modules accepted: Level of Service

## 2024-05-05 ENCOUNTER — Ambulatory Visit

## 2024-05-05 DIAGNOSIS — M7662 Achilles tendinitis, left leg: Secondary | ICD-10-CM

## 2024-05-05 DIAGNOSIS — M216X1 Other acquired deformities of right foot: Secondary | ICD-10-CM

## 2024-05-05 DIAGNOSIS — M216X2 Other acquired deformities of left foot: Secondary | ICD-10-CM

## 2024-05-05 DIAGNOSIS — M7661 Achilles tendinitis, right leg: Secondary | ICD-10-CM

## 2024-05-05 DIAGNOSIS — M722 Plantar fascial fibromatosis: Secondary | ICD-10-CM

## 2024-05-05 NOTE — Progress Notes (Signed)
 ORTHOTIC DISPENSING:   Reason for Visit:         Fitting and Delivery of Custom Fabricated Foot Orthoses Patient Report:            Patient reports comfort and is satisfied with device.   OBJECTIVE DATA: Patient History / Diagnosis:    No change in pathology Provided Device:                     Functional foot orthoses   GOAL OF ORTHOSIS - Improve gait - Decrease energy expenditure - Improve Balance - Provide Triplanar stability of foot complex - Facilitate motion   ACTIONS PERFORMED Patient was fit with custom foot orthoses   Patient was provided with verbal and written instruction and demonstration regarding wear, care, proper fit, function, and use of the orthosis.    Patient was also provided with verbal instruction regarding how to report any failures or malfunctions of the orthosis and necessary follow up care. Patient was also instructed to contact our office regarding any change in status that may affect the function of the orthosis.   Patient demonstrated understanding of all instructions.  Colleen Mills, DPM
